# Patient Record
Sex: Female | Born: 1990 | Race: White | Hispanic: No | Marital: Single | Smoking: Current every day smoker
Health system: Southern US, Community
[De-identification: ages and names within clinical notes are randomized; demographics above are authoritative.]

## PROBLEM LIST (undated history)

## (undated) DIAGNOSIS — E079 Disorder of thyroid, unspecified: Secondary | ICD-10-CM

## (undated) HISTORY — PX: TUBAL LIGATION: SHX77

---
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
## 2019-10-11 ENCOUNTER — Other Ambulatory Visit: Payer: Self-pay

## 2019-10-11 ENCOUNTER — Emergency Department
Admission: EM | Admit: 2019-10-11 | Discharge: 2019-10-11 | Disposition: A | Discharge status: Home / Self Care | Payer: Medicaid Other | Attending: Emergency Medicine | Admitting: Emergency Medicine

## 2019-10-11 ENCOUNTER — Emergency Department: Payer: Medicaid Other

## 2019-10-11 DIAGNOSIS — F17210 Nicotine dependence, cigarettes, uncomplicated: Secondary | ICD-10-CM | POA: Insufficient documentation

## 2019-10-11 DIAGNOSIS — J069 Acute upper respiratory infection, unspecified: Secondary | ICD-10-CM | POA: Insufficient documentation

## 2019-10-11 DIAGNOSIS — Z20822 Contact with and (suspected) exposure to covid-19: Secondary | ICD-10-CM | POA: Insufficient documentation

## 2019-10-11 HISTORY — DX: Disorder of thyroid, unspecified: E07.9

## 2019-10-11 LAB — SARS CORONAVIRUS 2 BY RT PCR (HOSPITAL ORDER, PERFORMED IN ~~LOC~~ HOSPITAL LAB): SARS Coronavirus 2: NEGATIVE

## 2019-10-11 MED ORDER — PSEUDOEPH-BROMPHEN-DM 30-2-10 MG/5ML PO SYRP: 5.0000 mL | ORAL_SOLUTION | Freq: Four times a day (QID) | ORAL | 0 refills | Status: DC | PRN

## 2019-10-11 NOTE — ED Triage Notes (Signed)
Pt c/o cough with runny nose for the past couple of days, "I just want to get checked out".Marland Kitchen

## 2019-10-11 NOTE — Discharge Instructions (Addendum)
Advised self quarantine pending results of COVID-19 test.  Test results will be in the MyChart app later today.  Your chest x-ray was unremarkable.  Follow discharge care instructions and take medication as directed.  If test is positive must quarantine additional 10 days.

## 2019-10-11 NOTE — ED Provider Notes (Signed)
[facility] Emergency Department Provider Note   ____________________________________________   First MD Initiated Contact with Patient 10/11/19 [phone]     (approximate)  I have reviewed the triage vital signs and the nursing notes.   HISTORY  Chief Complaint URI    HPI Stephanie Hartman is a 29 y.o. female patient complain of cough and runny nose for 3 to 4 days.  Patient denies recent travel or known contact with COVID-19.  Patient stated nausea, vomiting, diarrhea.  Patient states she has aversion to taking the vaccine.       Past Medical History:  Diagnosis Date  . Thyroid disease     There are no problems to display for this patient.   History reviewed. No pertinent surgical history.  Prior to Admission medications   Medication Sig Start Date End Date Taking? Authorizing Provider  brompheniramine-pseudoephedrine-DM 30-2-10 MG/5ML syrup Take 5 mLs by mouth 4 (four) times daily as needed. 10/11/19   Joni Reining, PA-C    Allergies Patient has no known allergies.  No family history on file.  Social History Social History   Tobacco Use  . Smoking status: Current Every Day Smoker    Types: Cigarettes  . Smokeless tobacco: Never Used  Substance Use Topics  . Alcohol use: Not Currently  . Drug use: Not Currently    Review of Systems Constitutional: No fever/chills Eyes: No visual changes. ENT: No sore throat.  Runny nose. Cardiovascular: Denies chest pain. Respiratory: Denies shortness of breath.  Nonproductive cough. Gastrointestinal: No abdominal pain.  No nausea, no vomiting.  No diarrhea.  No constipation. Genitourinary: Negative for dysuria. Musculoskeletal: Negative for back pain. Skin: Negative for rash. Neurological: Negative for headaches, focal weakness or numbness.   ____________________________________________   PHYSICAL EXAM:  VITAL SIGNS: ED Triage Vitals [10/11/19 0901]  Enc Vitals Group     BP 105/67      Pulse Rate 85     Resp 16     Temp 98.4 F (36.9 C)     Temp Source Oral     SpO2 100 %     Weight 120 lb (54.4 kg)     Height 5\' 1"  (1.549 m)     Head Circumference      Peak Flow      Pain Score 0     Pain Loc      Pain Edu?      Excl. in GC?    Constitutional: Alert and oriented. Well appearing and in no acute distress. Eyes: Conjunctivae are normal. PERRL. EOMI. Head: Atraumatic. Nose: Clear rhinorrhea.. Mouth/Throat: Mucous membranes are moist.  Oropharynx erythematous. Neck: No stridor.   Hematological/Lymphatic/Immunilogical: No cervical lymphadenopathy. Cardiovascular: Normal rate, regular rhythm. Grossly normal heart sounds.  Good peripheral circulation. Respiratory: Normal respiratory effort.  No retractions. Lungs CTAB. Gastrointestinal: Soft and nontender. No distention. No abdominal bruits. No CVA tenderness. Genitourinary: Deferred Skin:  Skin is warm, dry and intact. No rash noted. Psychiatric: Mood and affect are normal. Speech and behavior are normal.  ____________________________________________   LABS (all labs ordered are listed, but only abnormal results are displayed)  Labs Reviewed  SARS CORONAVIRUS 2 BY RT PCR (HOSPITAL ORDER, PERFORMED IN [facility])   ____________________________________________  EKG   ____________________________________________  RADIOLOGY  ED MD interpretation:    Official radiology report(s): DG Chest Portable 1 View  Result Date: 10/11/2019 CLINICAL DATA:  Cough and congestion. EXAM: PORTABLE CHEST 1 VIEW COMPARISON:  None. FINDINGS: The heart  size and mediastinal contours are within normal limits. Both lungs are clear. No acute osseous abnormality. Multilevel left rib posttraumatic deformities. IMPRESSION: No focal airspace disease. Electronically Signed   By: Stana Bunting M.D.   On: 10/11/2019 10:14    ____________________________________________   PROCEDURES  Procedure(s) performed  (including Critical Care):  Procedures   ____________________________________________   INITIAL IMPRESSION / ASSESSMENT AND PLAN / ED COURSE  As part of my medical decision making, I reviewed the following data within the electronic MEDICAL RECORD NUMBER     Patient presents with cough and runny nose for a few days.  Discussed no acute findings on chest x-ray.  Patient advised to self quarantine pending results of COVID-19 test.  Patient given discharge care instruction and prescription for Bromfed-DM.          ____________________________________________   FINAL CLINICAL IMPRESSION(S) / ED DIAGNOSES  Final diagnoses:  Upper respiratory tract infection, unspecified type     ED Discharge Orders         Ordered    brompheniramine-pseudoephedrine-DM 30-2-10 MG/5ML syrup  4 times daily PRN        10/11/19 1021          *Please note:  Stephanie Hartman was evaluated in Emergency Department on 10/11/2019 for the symptoms described in the history of present illness. She was evaluated in the context of the global COVID-19 pandemic, which necessitated consideration that the patient might be at risk for infection with the SARS-CoV-2 virus that causes COVID-19. Institutional protocols and algorithms that pertain to the evaluation of patients at risk for COVID-19 are in a state of rapid change based on information released by regulatory bodies including the CDC and federal and state organizations. These policies and algorithms were followed during the patient's care in the ED.  Some ED evaluations and interventions may be delayed as a result of limited staffing during and the pandemic.*   Note:  This document was prepared using Dragon voice recognition software and may include unintentional dictation errors.    Joni Reining, PA-C 10/11/19 1025    [place]ton [place], MD 10/11/19 1536

## 2019-11-08 ENCOUNTER — Emergency Department
Admission: EM | Admit: 2019-11-08 | Discharge: 2019-11-08 | Disposition: A | Discharge status: Home / Self Care | Payer: Medicaid Other | Attending: Emergency Medicine | Admitting: Emergency Medicine

## 2019-11-08 ENCOUNTER — Other Ambulatory Visit: Payer: Self-pay

## 2019-11-08 ENCOUNTER — Emergency Department: Payer: Medicaid Other

## 2019-11-08 DIAGNOSIS — R0789 Other chest pain: Secondary | ICD-10-CM | POA: Insufficient documentation

## 2019-11-08 DIAGNOSIS — F17210 Nicotine dependence, cigarettes, uncomplicated: Secondary | ICD-10-CM | POA: Insufficient documentation

## 2019-11-08 DIAGNOSIS — J40 Bronchitis, not specified as acute or chronic: Secondary | ICD-10-CM

## 2019-11-08 LAB — CBC
HCT: 39.4 % (ref 36.0–46.0)
Hemoglobin: 13.1 g/dL (ref 12.0–15.0)
MCH: 28.6 pg (ref 26.0–34.0)
MCHC: 33.2 g/dL (ref 30.0–36.0)
MCV: 86 fL (ref 80.0–100.0)
Platelets: 212 10*3/uL (ref 150–400)
RBC: 4.58 MIL/uL (ref 3.87–5.11)
RDW: 15.9 % — ABNORMAL HIGH (ref 11.5–15.5)
WBC: 7.5 10*3/uL (ref 4.0–10.5)
nRBC: 0 % (ref 0.0–0.2)

## 2019-11-08 LAB — BASIC METABOLIC PANEL
Anion gap: 8 (ref 5–15)
BUN: 19 mg/dL (ref 6–20)
CO2: 27 mmol/L (ref 22–32)
Calcium: 9.1 mg/dL (ref 8.9–10.3)
Chloride: 102 mmol/L (ref 98–111)
Creatinine, Ser: 0.91 mg/dL (ref 0.44–1.00)
GFR, Estimated: >60 mL/min (ref >60)
Glucose, Bld: 71 mg/dL (ref 70–99)
Potassium: 4 mmol/L (ref 3.5–5.1)
Sodium: 137 mmol/L (ref 135–145)

## 2019-11-08 LAB — TROPONIN I (HIGH SENSITIVITY): Troponin I (High Sensitivity): 3 ng/L (ref <18)

## 2019-11-08 MED ORDER — ALBUTEROL SULFATE HFA 108 (90 BASE) MCG/ACT IN AERS: 2.0000 | INHALATION_SPRAY | RESPIRATORY_TRACT | 1 refills | Status: DC | PRN

## 2019-11-08 MED ORDER — PREDNISONE 20 MG PO TABS: 40.0000 mg | ORAL_TABLET | Freq: Every day | ORAL | 0 refills | Status: AC

## 2019-11-08 MED ORDER — AZITHROMYCIN 250 MG PO TABS: ORAL_TABLET | ORAL | 0 refills | Status: AC

## 2019-11-08 NOTE — ED Notes (Signed)
Unable to complete e-sig for discharge d/t patient being in the hallway with no access to e-sig pad. Pt verbalized understanding of all discharge instructions, medications, and f/u care.

## 2019-11-08 NOTE — ED Provider Notes (Signed)
[facility] Emergency Department Provider Note  ____________________________________________   First MD Initiated Contact with Patient 11/08/19 1516     (approximate)  I have reviewed the triage vital signs and the nursing notes.   HISTORY  Chief Complaint Chest Pain    HPI Stephanie Hartman is a 29 y.o. female  Here with chest pain, cough. Pt reports that for the past 3 days, she's had a dry sensation in her chest with a "coldness" and aching pain, worse when she coughs. She works at [employer] and has been in and out of the large freezer, which has made her symptoms worse. She smokes and has been told she has "early COPD" but does not use an inhaler. CP is intermittent, no specific alleviating factors. CP is worse w/ coughing and with exposure to the cold. No fever, chills. Of note, her significant other did have COVID last week, she has had no fever or other sx.         Past Medical History:  Diagnosis Date  . Thyroid disease     There are no problems to display for this patient.   History reviewed. No pertinent surgical history.  Prior to Admission medications   Medication Sig Start Date End Date Taking? Authorizing Provider  albuterol (VENTOLIN HFA) 108 (90 Base) MCG/ACT inhaler Inhale 2 puffs into the lungs every 4 (four) hours as needed for wheezing or shortness of breath. 11/08/19   Shaune Pollack, MD  azithromycin (ZITHROMAX Z-PAK) 250 MG tablet Take 2 tablets (500 mg) on  Day 1,  followed by 1 tablet (250 mg) once daily on Days 2 through 5. 11/08/19 11/13/19  Shaune Pollack, MD  brompheniramine-pseudoephedrine-DM 30-2-10 MG/5ML syrup Take 5 mLs by mouth 4 (four) times daily as needed. 10/11/19   Joni Reining, PA-C  predniSONE (DELTASONE) 20 MG tablet Take 2 tablets (40 mg total) by mouth daily for 5 days. 11/08/19 11/13/19  Shaune Pollack, MD    Allergies Patient has no known allergies.  History reviewed. No pertinent family  history.  Social History Social History   Tobacco Use  . Smoking status: Current Every Day Smoker    Types: Cigarettes  . Smokeless tobacco: Never Used  Substance Use Topics  . Alcohol use: Not Currently  . Drug use: Not Currently    Review of Systems  Review of Systems  Constitutional: Positive for fatigue. Negative for fever.  HENT: Negative for congestion and sore throat.   Eyes: Negative for visual disturbance.  Respiratory: Positive for cough and shortness of breath.   Cardiovascular: Positive for chest pain.  Gastrointestinal: Negative for abdominal pain, diarrhea, nausea and vomiting.  Genitourinary: Negative for flank pain.  Musculoskeletal: Negative for back pain and neck pain.  Skin: Negative for rash and wound.  Neurological: Negative for weakness.     ____________________________________________  PHYSICAL EXAM:      VITAL SIGNS: ED Triage Vitals  Enc Vitals Group     BP 11/08/19 1222 117/73     Pulse Rate 11/08/19 1222 97     Resp 11/08/19 1222 18     Temp 11/08/19 1222 98.9 F (37.2 C)     Temp Source 11/08/19 1222 Oral     SpO2 11/08/19 1222 100 %     Weight 11/08/19 1217 120 lb (54.4 kg)     Height 11/08/19 1217 5\' 1"  (1.549 m)     Head Circumference --      Peak Flow --  Pain Score 11/08/19 1217 6     Pain Loc --      Pain Edu? --      Excl. in GC? --      Physical Exam Vitals and nursing note reviewed.  Constitutional:      General: She is not in acute distress.    Appearance: She is well-developed.  HENT:     Head: Normocephalic and atraumatic.  Eyes:     Conjunctiva/sclera: Conjunctivae normal.  Cardiovascular:     Rate and Rhythm: Normal rate and regular rhythm.     Heart sounds: Normal heart sounds. No murmur heard.  No friction rub.  Pulmonary:     Effort: Pulmonary effort is normal. No respiratory distress.     Breath sounds: Wheezing (Scant, end expiratory) present. No rales.  Abdominal:     General: There is no  distension.     Palpations: Abdomen is soft.     Tenderness: There is no abdominal tenderness.  Musculoskeletal:     Cervical back: Neck supple.  Skin:    General: Skin is warm.     Capillary Refill: Capillary refill takes less than 2 seconds.  Neurological:     Mental Status: She is alert and oriented to person, place, and time.     Motor: No abnormal muscle tone.       ____________________________________________   LABS (all labs ordered are listed, but only abnormal results are displayed)  Labs Reviewed  CBC - Abnormal; Notable for the following components:      Result Value   RDW 15.9 (*)    All other components within normal limits  BASIC METABOLIC PANEL  POC URINE PREG, ED  TROPONIN I (HIGH SENSITIVITY)  TROPONIN I (HIGH SENSITIVITY)    ____________________________________________  EKG: Normal sinus rhythm, ventricular rate 95.  PR 144, QRS 64, QTc 432.  No acute ST elevations or depressions.  No ischemia or infarct. ________________________________________  RADIOLOGY All imaging, including plain films, CT scans, and ultrasounds, independently reviewed by me, and interpretations confirmed via formal radiology reads.  ED MD interpretation:   Chest x-ray: COPD, no acute abnormality  Official radiology report(s): DG Chest 2 View  Result Date: 11/08/2019 CLINICAL DATA:  Chest pain cough short of breath 2 days.  Smoker. EXAM: CHEST - 2 VIEW COMPARISON:  10/11/2019 FINDINGS: Pulmonary hyperinflation compatible with COPD and emphysema. Lungs are clear without infiltrate or effusion. Heart size and vascularity normal. Chronic left rib fractures again noted and unchanged. IMPRESSION: COPD.  No acute cardiopulmonary abnormality. Electronically Signed   By: Marlan Palau M.D.   On: 11/08/2019 13:05    ____________________________________________  PROCEDURES   Procedure(s) performed (including Critical  Care):  Procedures  ____________________________________________  INITIAL IMPRESSION / MDM / ASSESSMENT AND PLAN / ED COURSE  As part of my medical decision making, I reviewed the following data within the electronic MEDICAL RECORD NUMBER Nursing notes reviewed and incorporated, Old chart reviewed, Notes from prior ED visits, and [place] Controlled Substance Database       *Stephanie Hartman was evaluated in Emergency Department on 11/08/2019 for the symptoms described in the history of present illness. She was evaluated in the context of the global COVID-19 pandemic, which necessitated consideration that the patient might be at risk for infection with the SARS-CoV-2 virus that causes COVID-19. Institutional protocols and algorithms that pertain to the evaluation of patients at risk for COVID-19 are in a state of rapid change based on information released by regulatory bodies including  the [employer] and federal and state organizations. These policies and algorithms were followed during the patient's care in the ED.  Some ED evaluations and interventions may be delayed as a result of limited staffing during the pandemic.*     Medical Decision Making: 29 year old female here with atypical chest pain, cough, and dryness sensation in her chest.  Patient has mild, early COPD and suspect she has acute COPD exacerbation in the setting of cold exposure versus viral URI.  She does have a previous Covid exposure, will check Covid as an outpatient though she is not hypoxic and does not meet inpatient criteria for this.  Her chest x-ray shows no focal pneumonia.  This was reviewed by me.  EKG shows no evidence of ischemia or infarct and troponin is negative despite symptoms fairly constant for greater than 24 hours, making ACS highly unlikely.  She is PERC negative no signs of DVT/PE.  Will treat with albuterol, steroids, and outpatient follow-up. Given +sputum production, will add Azithro as  well.  ____________________________________________  FINAL CLINICAL IMPRESSION(S) / ED DIAGNOSES  Final diagnoses:  Atypical chest pain  Bronchitis     MEDICATIONS GIVEN DURING THIS VISIT:  Medications - No data to display   ED Discharge Orders         Ordered    albuterol (VENTOLIN HFA) 108 (90 Base) MCG/ACT inhaler  Every 4 hours PRN        11/08/19 1531    predniSONE (DELTASONE) 20 MG tablet  Daily        11/08/19 1531    azithromycin (ZITHROMAX Z-PAK) 250 MG tablet        11/08/19 1531           Note:  This document was prepared using Dragon voice recognition software and may include unintentional dictation errors.   Shaune Pollack, MD 11/08/19 [phone]

## 2019-11-08 NOTE — ED Triage Notes (Signed)
Pt comes POV for chest pain for 2 days. Pt states a "dry feeling" in the center upper part of her chest.

## 2020-05-15 ENCOUNTER — Other Ambulatory Visit: Payer: Self-pay

## 2020-05-15 ENCOUNTER — Emergency Department: Payer: Medicaid Other

## 2020-05-15 ENCOUNTER — Emergency Department
Admission: EM | Admit: 2020-05-15 | Discharge: 2020-05-15 | Disposition: A | Discharge status: Home / Self Care | Payer: Medicaid Other | Attending: Emergency Medicine | Admitting: Emergency Medicine

## 2020-05-15 ENCOUNTER — Other Ambulatory Visit (HOSPITAL_COMMUNITY): Payer: Self-pay | Admitting: Interventional Radiology

## 2020-05-15 ENCOUNTER — Encounter: Payer: Self-pay | Admitting: Emergency Medicine

## 2020-05-15 DIAGNOSIS — R5383 Other fatigue: Secondary | ICD-10-CM | POA: Insufficient documentation

## 2020-05-15 DIAGNOSIS — R220 Localized swelling, mass and lump, head: Secondary | ICD-10-CM | POA: Insufficient documentation

## 2020-05-15 DIAGNOSIS — F17210 Nicotine dependence, cigarettes, uncomplicated: Secondary | ICD-10-CM | POA: Insufficient documentation

## 2020-05-15 DIAGNOSIS — Q2730 Arteriovenous malformation, site unspecified: Secondary | ICD-10-CM | POA: Insufficient documentation

## 2020-05-15 DIAGNOSIS — R55 Syncope and collapse: Secondary | ICD-10-CM | POA: Insufficient documentation

## 2020-05-15 LAB — CBC
HCT: 42.2 % (ref 36.0–46.0)
Hemoglobin: 14.5 g/dL (ref 12.0–15.0)
MCH: 31.7 pg (ref 26.0–34.0)
MCHC: 34.4 g/dL (ref 30.0–36.0)
MCV: 92.1 fL (ref 80.0–100.0)
Platelets: 205 10*3/uL (ref 150–400)
RBC: 4.58 MIL/uL (ref 3.87–5.11)
RDW: 12.6 % (ref 11.5–15.5)
WBC: 6.6 10*3/uL (ref 4.0–10.5)
nRBC: 0 % (ref 0.0–0.2)

## 2020-05-15 LAB — BASIC METABOLIC PANEL
Anion gap: 10 (ref 5–15)
BUN: 21 mg/dL — ABNORMAL HIGH (ref 6–20)
CO2: 22 mmol/L (ref 22–32)
Calcium: 10 mg/dL (ref 8.9–10.3)
Chloride: 102 mmol/L (ref 98–111)
Creatinine, Ser: 0.95 mg/dL (ref 0.44–1.00)
GFR, Estimated: >60 mL/min (ref >60)
Glucose, Bld: 123 mg/dL — ABNORMAL HIGH (ref 70–99)
Potassium: 3.9 mmol/L (ref 3.5–5.1)
Sodium: 134 mmol/L — ABNORMAL LOW (ref 135–145)

## 2020-05-15 LAB — TROPONIN I (HIGH SENSITIVITY): Troponin I (High Sensitivity): <2 ng/L (ref <18)

## 2020-05-15 LAB — D-DIMER, QUANTITATIVE: D-Dimer, Quant: <0.27 ug/mL-FEU (ref 0.00–0.50)

## 2020-05-15 IMAGING — CT CT ANGIO HEAD
3 of 10 series · 16 of 47 positions shown · IV contrast (omnipaque)
Comparison: None

CLINICAL DATA: Moderate to severe prior head trauma several months
ago. Now with pulsatile mass left temporal area. Dizziness and
syncope

EXAM:
CT ANGIOGRAPHY HEAD
TECHNIQUE: Multidetector CT imaging of the head was performed using the
standard protocol during bolus administration of intravenous
contrast. Multiplanar CT image reconstructions and MIPs were
obtained to evaluate the vascular anatomy.
CONTRAST:  75mL OMNIPAQUE IOHEXOL 350 MG/ML SOLN

[Series 11: ax thin · axial · 0.33mm/px · z∈[-182,-52]mm · 10 of 153 slices shown]
[im 11/153  brain]
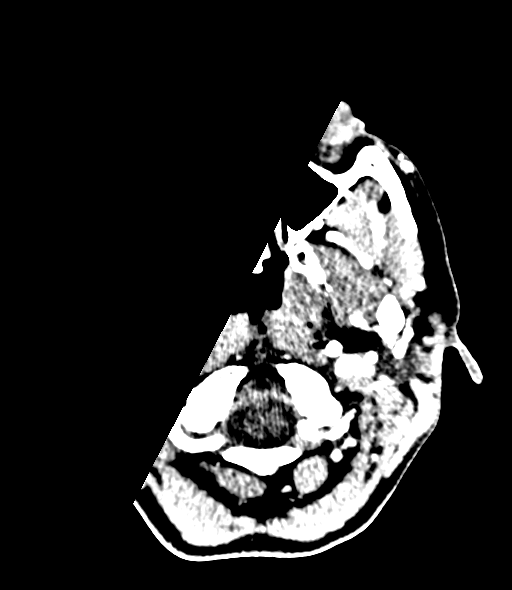
[im 31/153  bone]
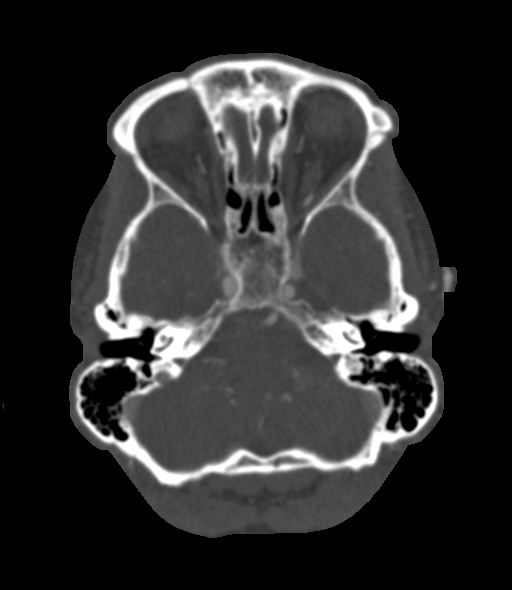
[im 41/153  brain]
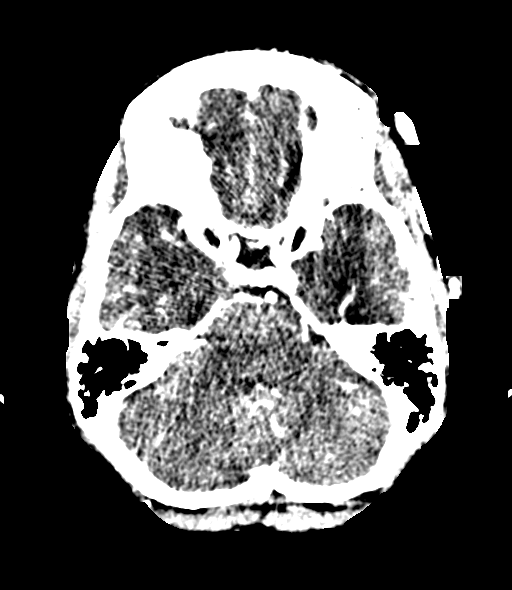
[im 51/153  bone]
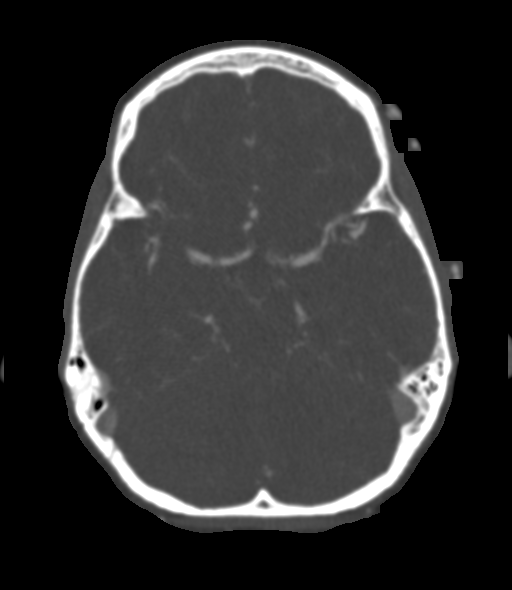
[im 71/153  brain]
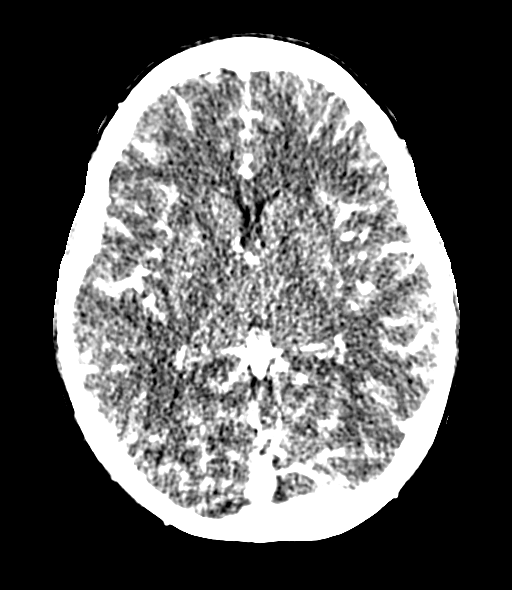
[im 82/153  bone]
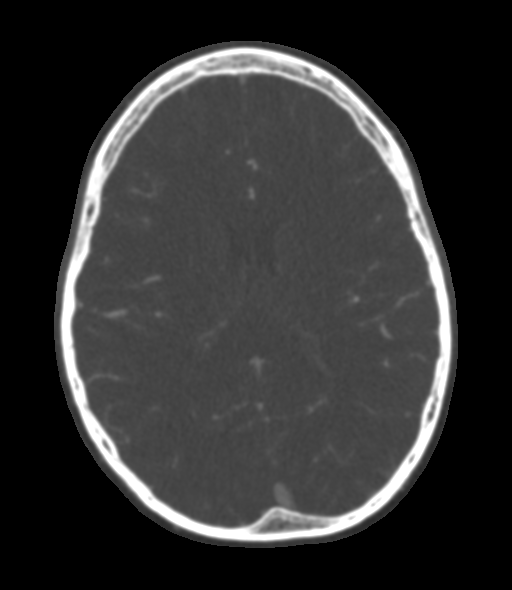
[im 102/153  brain]
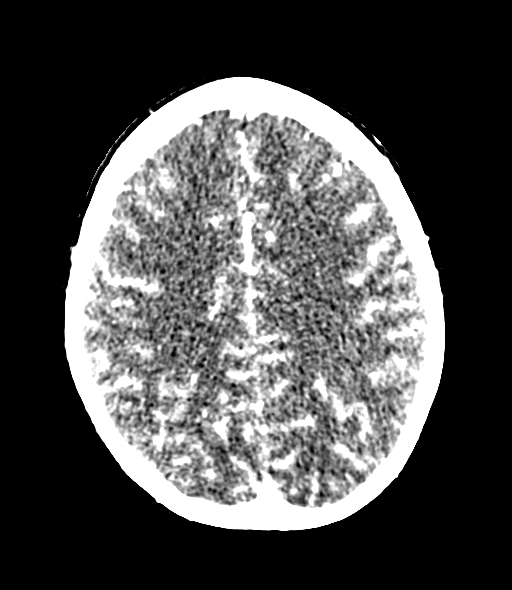
[im 112/153  bone]
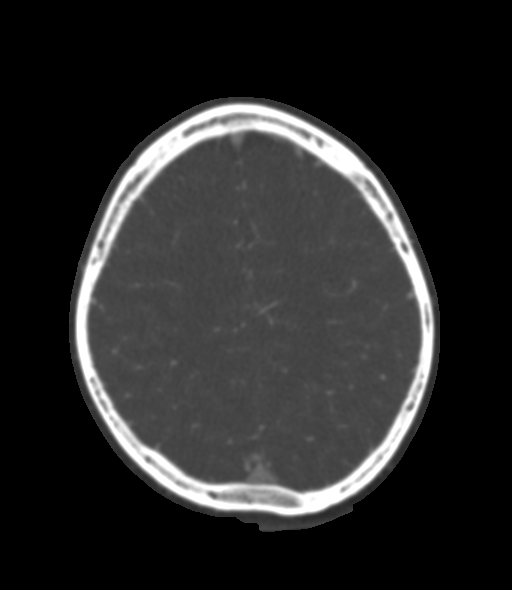
[im 122/153  brain]
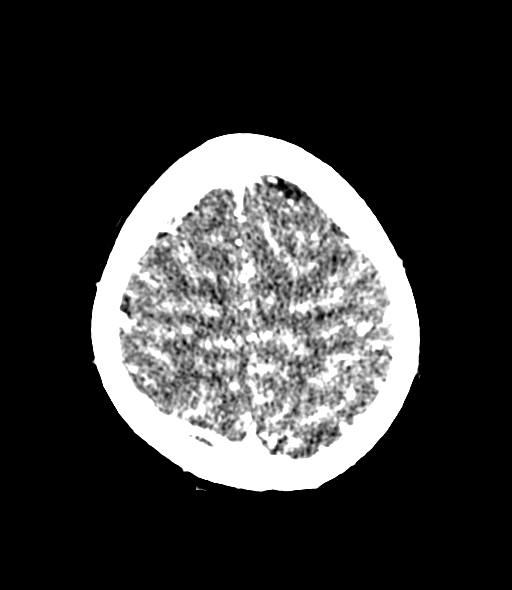
[im 142/153  bone]
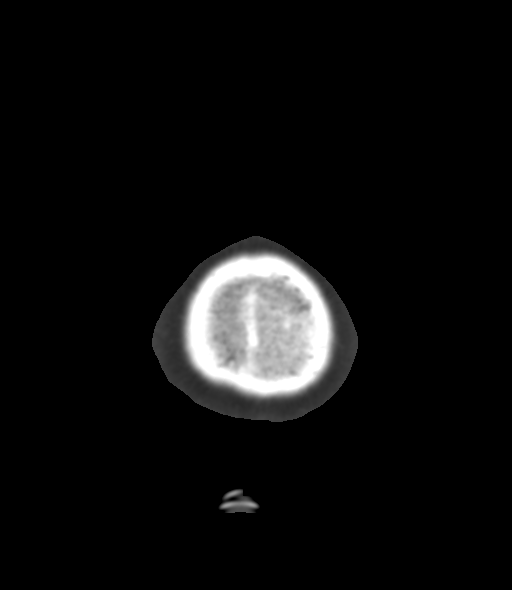

[Series 13: cor thin · coronal · 0.31mm/px · 3 of 177 slices shown]
[im 36/177  brain]
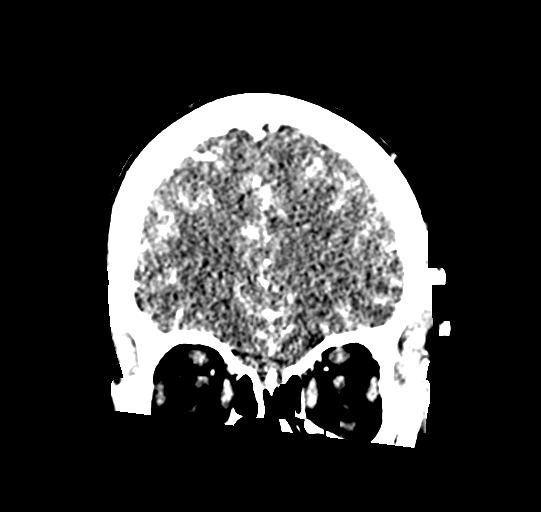
[im 71/177  brain]
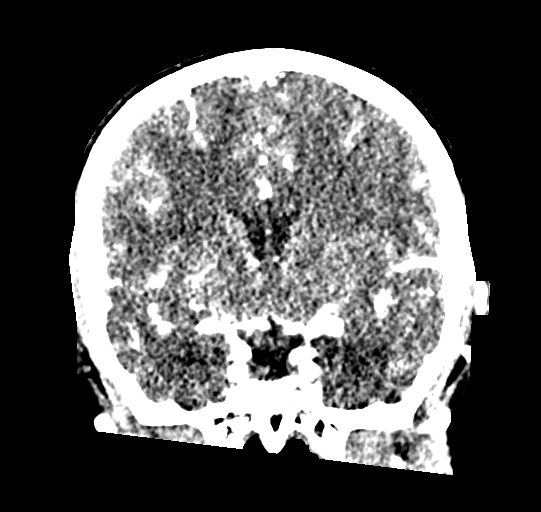
[im 106/177  brain]
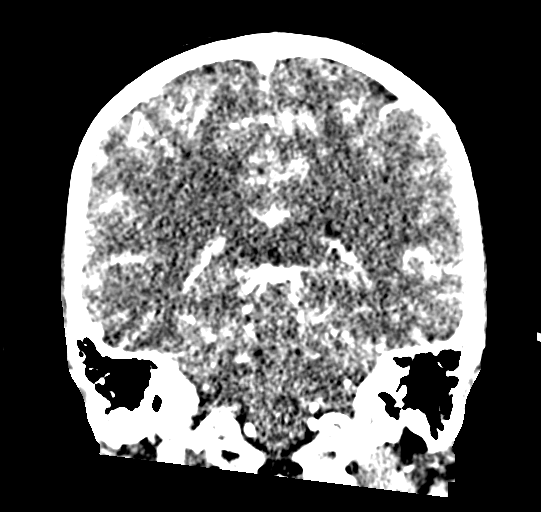

[Series 15: sag thin · sagittal · 0.31mm/px · 3 of 150 slices shown]
[im 38/150  brain]
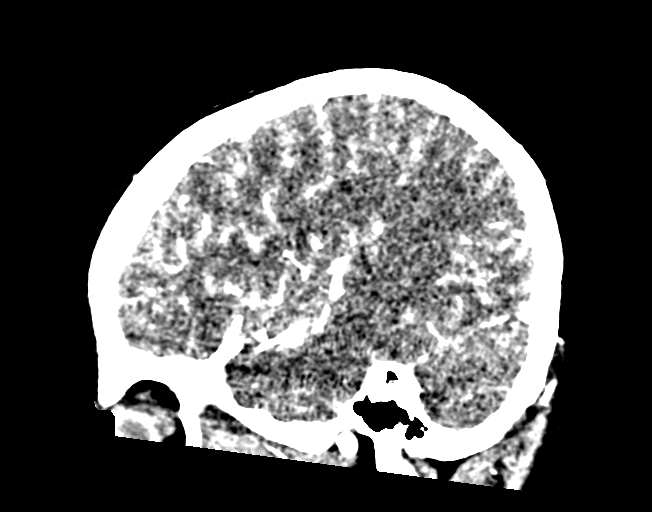
[im 75/150  brain]
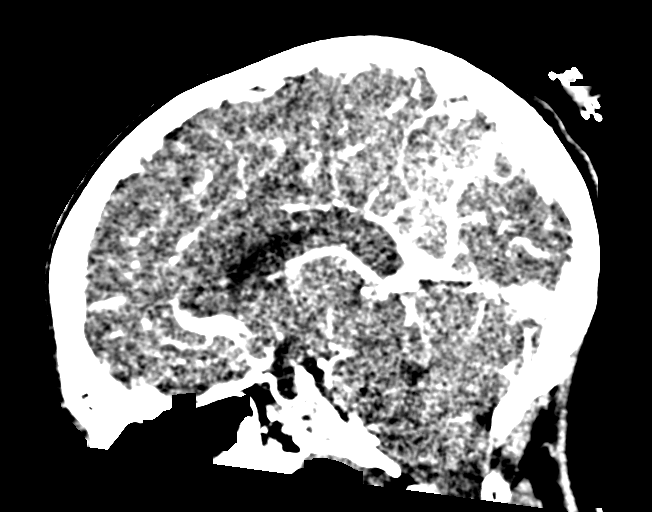
[im 112/150  brain]
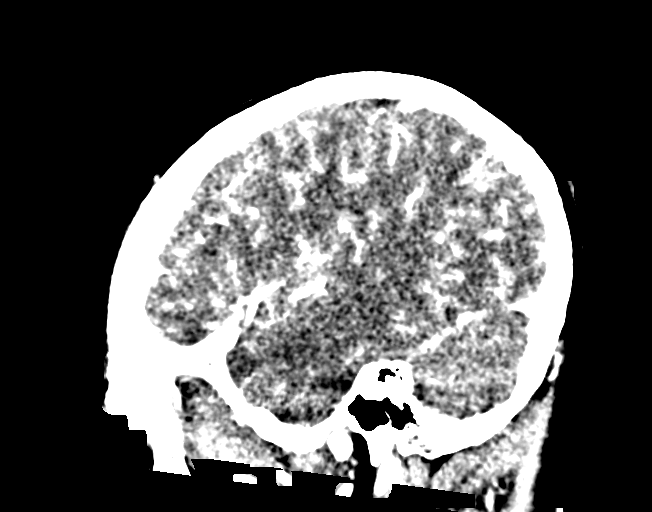

[16 of 47 positions shown; findings below may reference images not displayed]

FINDINGS: CT HEAD

Brain: No evidence of acute infarction, hemorrhage, hydrocephalus,
extra-axial collection or mass lesion/mass effect.

Vascular: Negative for hyperdense vessel

Skull: No skull fracture or skull lesion identified.

Sinuses: Negative

Other: None

CTA HEAD

Anterior circulation: No significant stenosis, proximal occlusion,
aneurysm, or vascular malformation. No intracranial vascular
malformation in the anterior circulation.

Posterior circulation: Both vertebral arteries patent to the
basilar. PICA patent bilaterally. Basilar widely patent. Superior
cerebellar and posterior cerebral arteries normal bilaterally. No
vascular malformation in the posterior circulation.

Venous sinuses: Normal and symmetric venous enhancement bilaterally.

Anatomic variants: None

Multiple tortuous and dilated vessels in the left temporal scalp.
These are fed by an enlarged left temporal artery. Fusiform
dilatation of a vessel lateral and superior to the orbit measuring
approximately 3.7 mm in diameter is associated with the vascular
malformation. No extravasation or hematoma identified. No early
draining vein identified however this study is in the early arterial
phase.

Review of the MIP images confirms the above findings.
IMPRESSION: Normal intracranial circulation.  Normal CT head

Moderate to large vascular malformation in the left temporal scalp
most likely arteriovenous malformation. This could be congenital or
posttraumatic. Fusiform dilatation of a vessel superior and
posterior to the orbit measuring up to 3.7 mm in diameter.

These results were called by telephone at the time of interpretation
on 05/15/2020 at [DATE] to provider FREDY ALFONSO YATRA , who verbally
acknowledged these results.

## 2020-05-15 MED ORDER — LACTATED RINGERS IV BOLUS
1000.0000 mL | Freq: Once | INTRAVENOUS | Status: AC
  Administered 2020-05-15: 1000 mL via INTRAVENOUS

## 2020-05-15 MED ORDER — IOHEXOL 350 MG/ML SOLN
75.0000 mL | Freq: Once | INTRAVENOUS | Status: AC | PRN
  Administered 2020-05-15: 75 mL via INTRAVENOUS

## 2020-05-15 NOTE — Discharge Instructions (Signed)
Call one of the two numbers to set up follow-up this week or early next week for your vascular issue: Stephanie Hartman [phone] Stephanie Hartman [phone]  For your syncope: Drink at least 8 glasses of water daily Return to the ER as needed

## 2020-05-15 NOTE — ED Notes (Signed)
Pt to ED for centralized cp and syncopal episodes x2 weeks. Denies hitting head.  Denies N/V, shob

## 2020-05-15 NOTE — ED Provider Notes (Signed)
[facility] Emergency Department Provider Note  ____________________________________________   Event Date/Time   First MD Initiated Contact with Patient 05/15/20 1128     (approximate)  I have reviewed the triage vital signs and the nursing notes.   HISTORY  Chief Complaint Near Syncope    HPI Stephanie Hartman is a 30 y.o. female here with near syncopal episode x2.  The patient states that over the last week, she has had 2 episodes in which she feels lightheaded and like she is going to pass out.  Both of these episodes have occurred when she is standing.  She states that twice, she has felt like she suddenly feels very lightheaded.  She feels like her vision is "blacking out."  She states that both of these times, she was able to sit down, take a deep breath, and applied cool rag to her head.  She believes she may have briefly lost consciousness the first time but the second time did not.  The last episode was yesterday.  She reports that otherwise, she has felt well.  No recent fevers or chills.  No recent medication changes.  She has a history of similar episodes in the past but not 2 episodes in the last week.  She does admit that she works a lot and is on her feet a lot.  She tries to drink but does not always drink as much as she normally should.  Otherwise, she incidentally makes note of an increasing area of swelling along her left temple area.  This is a appeared and worsened over the last month.  She feels like she can feel her heartbeat in it.  It is felt warm in the past.  Denies any trauma here recently, though did have trauma several years ago.  No piercings.  No other injury to the area.  No other complaints.  No vision changes.        Past Medical History:  Diagnosis Date  . Thyroid disease     There are no problems to display for this patient.   History reviewed. No pertinent surgical history.  Prior to Admission medications   Medication Sig  Start Date End Date Taking? Authorizing Provider  albuterol (VENTOLIN HFA) 108 (90 Base) MCG/ACT inhaler Inhale 2 puffs into the lungs every 4 (four) hours as needed for wheezing or shortness of breath. 11/08/19   Shaune Pollack, MD  brompheniramine-pseudoephedrine-DM 30-2-10 MG/5ML syrup Take 5 mLs by mouth 4 (four) times daily as needed. 10/11/19   Joni Reining, PA-C    Allergies Patient has no known allergies.  History reviewed. No pertinent family history.  Social History Social History   Tobacco Use  . Smoking status: Current Every Day Smoker    Types: Cigarettes  . Smokeless tobacco: Never Used  Substance Use Topics  . Alcohol use: Not Currently  . Drug use: Not Currently    Review of Systems  Review of Systems  Constitutional: Positive for fatigue. Negative for chills and fever.  HENT: Negative for congestion and sore throat.   Eyes: Negative for visual disturbance.  Respiratory: Negative for cough and shortness of breath.   Cardiovascular: Negative for chest pain.  Gastrointestinal: Negative for abdominal pain, diarrhea, nausea and vomiting.  Genitourinary: Negative for flank pain.  Musculoskeletal: Negative for back pain and neck pain.  Skin: Negative for rash and wound.  Allergic/Immunologic: Negative for immunocompromised state.  Neurological: Positive for weakness and headaches. Negative for numbness.  Hematological: Does not  bruise/bleed easily.     ____________________________________________  PHYSICAL EXAM:      VITAL SIGNS: ED Triage Vitals  Enc Vitals Group     BP 05/15/20 1124 112/85     Pulse Rate 05/15/20 1124 92     Resp 05/15/20 1124 16     Temp 05/15/20 1124 98.2 F (36.8 C)     Temp Source 05/15/20 1124 Oral     SpO2 05/15/20 1124 98 %     Weight 05/15/20 1121 130 lb (59 kg)     Height 05/15/20 1121 5\' 1"  (1.549 m)     Head Circumference --      Peak Flow --      Pain Score 05/15/20 1121 0     Pain Loc --      Pain Edu? --       Excl. in GC? --      Physical Exam Vitals and nursing note reviewed.  Constitutional:      General: She is not in acute distress.    Appearance: She is well-developed.  HENT:     Head: Normocephalic and atraumatic.     Comments: Swelling noted to left temporal artery with thrill, palpable and audible. No erythema. No fluctuance. Eyes:     Conjunctiva/sclera: Conjunctivae normal.  Cardiovascular:     Rate and Rhythm: Normal rate and regular rhythm.     Heart sounds: Normal heart sounds. No murmur heard. No friction rub.  Pulmonary:     Effort: Pulmonary effort is normal. No respiratory distress.     Breath sounds: Normal breath sounds. No wheezing or rales.  Abdominal:     General: There is no distension.     Palpations: Abdomen is soft.     Tenderness: There is no abdominal tenderness.  Musculoskeletal:     Cervical back: Neck supple.  Skin:    General: Skin is warm.     Capillary Refill: Capillary refill takes less than 2 seconds.  Neurological:     Mental Status: She is alert and oriented to person, place, and time.     Motor: No abnormal muscle tone.       ____________________________________________   LABS (all labs ordered are listed, but only abnormal results are displayed)  Labs Reviewed  BASIC METABOLIC PANEL - Abnormal; Notable for the following components:      Result Value   Sodium 134 (*)    Glucose, Bld 123 (*)    BUN 21 (*)    All other components within normal limits  CBC  D-DIMER, QUANTITATIVE  URINALYSIS, COMPLETE (UACMP) WITH MICROSCOPIC  CBG MONITORING, ED  POC URINE PREG, ED  TROPONIN I (HIGH SENSITIVITY)    ____________________________________________  EKG: Normal sinus rhythm, ventricular rate 80.  PR 147, QRS 78, QTc 420.  No acute ST elevations or depressions.  No acute evidence of acute ischemia or infarct. ________________________________________  RADIOLOGY All imaging, including plain films, CT scans, and ultrasounds,  independently reviewed by me, and interpretations confirmed via formal radiology reads.  ED MD interpretation:   CT angio head: AV malformation at the left temporal scalp, likely involving the temporal artery,  Official radiology report(s): CT Angio Head W or Wo Contrast  Result Date: 05/15/2020 CLINICAL DATA:  Moderate to severe prior head trauma several months ago. Now with pulsatile mass left temporal area. Dizziness and syncope EXAM: CT ANGIOGRAPHY HEAD TECHNIQUE: Multidetector CT imaging of the head was performed using the standard protocol during bolus administration of intravenous contrast. Multiplanar  CT image reconstructions and MIPs were obtained to evaluate the vascular anatomy. CONTRAST:  54mL OMNIPAQUE IOHEXOL 350 MG/ML SOLN COMPARISON:  None FINDINGS: CT HEAD Brain: No evidence of acute infarction, hemorrhage, hydrocephalus, extra-axial collection or mass lesion/mass effect. Vascular: Negative for hyperdense vessel Skull: No skull fracture or skull lesion identified. Sinuses: Negative Other: None CTA HEAD Anterior circulation: No significant stenosis, proximal occlusion, aneurysm, or vascular malformation. No intracranial vascular malformation in the anterior circulation. Posterior circulation: Both vertebral arteries patent to the basilar. PICA patent bilaterally. Basilar widely patent. Superior cerebellar and posterior cerebral arteries normal bilaterally. No vascular malformation in the posterior circulation. Venous sinuses: Normal and symmetric venous enhancement bilaterally. Anatomic variants: None Multiple tortuous and dilated vessels in the left temporal scalp. These are fed by an enlarged left temporal artery. Fusiform dilatation of a vessel lateral and superior to the orbit measuring approximately 3.7 mm in diameter is associated with the vascular malformation. No extravasation or hematoma identified. No early draining vein identified however this study is in the early arterial phase.  Review of the MIP images confirms the above findings. IMPRESSION: Normal intracranial circulation.  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 This could be congenital or posttraumatic. Fusiform dilatation of a vessel superior and posterior to the orbit measuring up to 3.7 mm in diameter. These results were called by telephone at the time of interpretation on 05/15/2020 at 12:49 pm to provider Shaune Pollack , who verbally acknowledged these results. Electronically Signed   By: Marlan Palau M.D.   On: 05/15/2020 12:52    ____________________________________________  PROCEDURES   Procedure(s) performed (including Critical Care):  Procedures  ____________________________________________  INITIAL IMPRESSION / MDM / ASSESSMENT AND PLAN / ED COURSE  As part of my medical decision making, I reviewed the following data within the electronic MEDICAL RECORD NUMBER Nursing notes reviewed and incorporated, Old chart reviewed, Notes from prior ED visits, and [place] Controlled Substance Database       *Anina Schnake was evaluated in Emergency Department on 05/15/2020 for the symptoms described in the history of present illness. She was evaluated in the context of the global COVID-19 pandemic, which necessitated consideration that the patient might be at risk for infection with the SARS-CoV-2 virus that causes COVID-19. Institutional protocols and algorithms that pertain to the evaluation of patients at risk for COVID-19 are in a state of rapid change based on information released by regulatory bodies including the CDC and federal and state organizations. These policies and algorithms were followed during the patient's care in the ED.  Some ED evaluations and interventions may be delayed as a result of limited staffing during the pandemic.*     Medical Decision Making: 30 year old female here with syncopal episode.  Regarding her syncopal  episode, patient had prodrome and was standing at both times, likely orthostatic versus vasovagal.  She was actually able to stop herself from truly syncopized in, which is reassuring.  She has normal EKG with no evidence of arrhythmia, ischemia, or other abnormality here.  No clinical signs of DVT or PE and she is D-dimer negative.  She is not anemic.  Electrolytes acceptable.  No murmur or signs of valvular disorder.  Will encourage hydration and give return precautions.  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  CT angio was obtained after discussing with Dr. Chestine Spore, which does show a large vascular malformation.  I discussed with Dr.  Deveshwar of Neuro interventional who will see the pt this week or early next week in clinic for further work-up/management. Pt notified and is in agreement.   ____________________________________________  FINAL CLINICAL IMPRESSION(S) / ED DIAGNOSES  Final diagnoses:  Syncope and collapse  AVM (arteriovenous malformation)     MEDICATIONS GIVEN DURING THIS VISIT:  Medications  lactated ringers bolus 1,000 mL (1,000 mLs Intravenous New Bag/Given 05/15/20 1231)  iohexol (OMNIPAQUE) 350 MG/ML injection 75 mL (75 mLs Intravenous Contrast Given 05/15/20 1212)     ED Discharge Orders    None       Note:  This document was prepared using Dragon voice recognition software and may include unintentional dictation errors.   Shaune PollackIsaacs, Jaci Desanto, MD 05/15/20 1352

## 2020-05-15 NOTE — ED Triage Notes (Signed)
Pt comes into the ED via POV c/o syncopal episodes with the l;ast one being last night.  Pt states she was dizzy before it happened.  Pt also c/o CP that is centrally located.  Pt denies any medical problems other than hypothyroidism.  Pt states she has had a cough that started this week and thinks the CP is related to that.  Pt ambulatory to exam room for triage and has even and unlabored respirations.

## 2020-05-22 ENCOUNTER — Other Ambulatory Visit: Payer: Self-pay

## 2020-05-22 ENCOUNTER — Ambulatory Visit (HOSPITAL_COMMUNITY)
Admission: RE | Admit: 2020-05-22 | Discharge: 2020-05-22 | Disposition: A | Discharge status: Home / Self Care | Payer: Self-pay | Source: Ambulatory Visit | Attending: Interventional Radiology | Admitting: Interventional Radiology

## 2020-05-22 DIAGNOSIS — Q2730 Arteriovenous malformation, site unspecified: Secondary | ICD-10-CM

## 2020-05-23 HISTORY — PX: IR RADIOLOGIST EVAL & MGMT: IMG5224

## 2020-05-24 ENCOUNTER — Other Ambulatory Visit (HOSPITAL_COMMUNITY): Payer: Self-pay | Admitting: Interventional Radiology

## 2020-05-24 DIAGNOSIS — H93A9 Pulsatile tinnitus, unspecified ear: Secondary | ICD-10-CM

## 2020-05-29 ENCOUNTER — Other Ambulatory Visit: Payer: Self-pay | Admitting: Radiology

## 2020-05-30 ENCOUNTER — Ambulatory Visit (HOSPITAL_COMMUNITY)
Admission: RE | Admit: 2020-05-30 | Discharge: 2020-05-30 | Disposition: A | Discharge status: Home / Self Care | Payer: Medicaid Other | Source: Ambulatory Visit | Attending: Interventional Radiology | Admitting: Interventional Radiology

## 2020-05-30 ENCOUNTER — Other Ambulatory Visit (HOSPITAL_COMMUNITY): Payer: Self-pay | Admitting: Interventional Radiology

## 2020-05-30 ENCOUNTER — Other Ambulatory Visit: Payer: Self-pay

## 2020-05-30 DIAGNOSIS — Z91041 Radiographic dye allergy status: Secondary | ICD-10-CM | POA: Insufficient documentation

## 2020-05-30 DIAGNOSIS — H93A9 Pulsatile tinnitus, unspecified ear: Secondary | ICD-10-CM

## 2020-05-30 DIAGNOSIS — R55 Syncope and collapse: Secondary | ICD-10-CM | POA: Insufficient documentation

## 2020-05-30 DIAGNOSIS — Q279 Congenital malformation of peripheral vascular system, unspecified: Secondary | ICD-10-CM | POA: Insufficient documentation

## 2020-05-30 DIAGNOSIS — F17210 Nicotine dependence, cigarettes, uncomplicated: Secondary | ICD-10-CM | POA: Insufficient documentation

## 2020-05-30 DIAGNOSIS — I671 Cerebral aneurysm, nonruptured: Secondary | ICD-10-CM | POA: Insufficient documentation

## 2020-05-30 DIAGNOSIS — H538 Other visual disturbances: Secondary | ICD-10-CM | POA: Insufficient documentation

## 2020-05-30 HISTORY — PX: IR ANGIO INTRA EXTRACRAN SEL COM CAROTID INNOMINATE BILAT MOD SED: IMG5360

## 2020-05-30 HISTORY — PX: IR ANGIO VERTEBRAL SEL SUBCLAVIAN INNOMINATE BILAT MOD SED: IMG5366

## 2020-05-30 LAB — CBC
HCT: 40 % (ref 36.0–46.0)
Hemoglobin: 13.1 g/dL (ref 12.0–15.0)
MCH: 31.6 pg (ref 26.0–34.0)
MCHC: 32.8 g/dL (ref 30.0–36.0)
MCV: 96.4 fL (ref 80.0–100.0)
Platelets: 204 10*3/uL (ref 150–400)
RBC: 4.15 MIL/uL (ref 3.87–5.11)
RDW: 12.3 % (ref 11.5–15.5)
WBC: 5.7 10*3/uL (ref 4.0–10.5)
nRBC: 0 % (ref 0.0–0.2)

## 2020-05-30 LAB — PROTIME-INR
INR: 1 (ref 0.8–1.2)
Prothrombin Time: 12.8 seconds (ref 11.4–15.2)

## 2020-05-30 LAB — BASIC METABOLIC PANEL
Anion gap: 5 (ref 5–15)
BUN: 16 mg/dL (ref 6–20)
CO2: 25 mmol/L (ref 22–32)
Calcium: 8.7 mg/dL — ABNORMAL LOW (ref 8.9–10.3)
Chloride: 109 mmol/L (ref 98–111)
Creatinine, Ser: 0.81 mg/dL (ref 0.44–1.00)
GFR, Estimated: >60 mL/min (ref >60)
Glucose, Bld: 86 mg/dL (ref 70–99)
Potassium: 4 mmol/L (ref 3.5–5.1)
Sodium: 139 mmol/L (ref 135–145)

## 2020-05-30 LAB — PREGNANCY, URINE: Preg Test, Ur: NEGATIVE

## 2020-05-30 MED ORDER — DIPHENHYDRAMINE HCL 25 MG PO CAPS
50.0000 mg | ORAL_CAPSULE | ORAL | Status: DC
  Filled 2020-05-30: qty 2

## 2020-05-30 MED ORDER — SODIUM CHLORIDE 0.9 % IV SOLN: INTRAVENOUS | Status: AC

## 2020-05-30 MED ORDER — SODIUM CHLORIDE 0.9 % IV BOLUS
INTRAVENOUS | Status: AC | PRN
  Administered 2020-05-30: 250 mL via INTRAVENOUS

## 2020-05-30 MED ORDER — MIDAZOLAM HCL 2 MG/2ML IJ SOLN
INTRAMUSCULAR | Status: AC | PRN
  Administered 2020-05-30: 1 mg via INTRAVENOUS

## 2020-05-30 MED ORDER — MIDAZOLAM HCL 2 MG/2ML IJ SOLN
INTRAMUSCULAR | Status: AC
  Filled 2020-05-30: qty 2

## 2020-05-30 MED ORDER — LIDOCAINE HCL 1 % IJ SOLN
INTRAMUSCULAR | Status: AC
  Filled 2020-05-30: qty 20

## 2020-05-30 MED ORDER — DIPHENHYDRAMINE HCL 50 MG/ML IJ SOLN
INTRAMUSCULAR | Status: AC
  Administered 2020-05-30: 50 mg via INTRAVENOUS
  Filled 2020-05-30: qty 1

## 2020-05-30 MED ORDER — HEPARIN SODIUM (PORCINE) 1000 UNIT/ML IJ SOLN
INTRAMUSCULAR | Status: AC
  Filled 2020-05-30: qty 1

## 2020-05-30 MED ORDER — HEPARIN SODIUM (PORCINE) 1000 UNIT/ML IJ SOLN
INTRAMUSCULAR | Status: AC | PRN
  Administered 2020-05-30: 1000 [IU] via INTRAVENOUS

## 2020-05-30 MED ORDER — IOHEXOL 300 MG/ML  SOLN
150.0000 mL | Freq: Once | INTRAMUSCULAR | Status: AC | PRN
  Administered 2020-05-30: 75 mL

## 2020-05-30 MED ORDER — FENTANYL CITRATE (PF) 100 MCG/2ML IJ SOLN
INTRAMUSCULAR | Status: AC | PRN
  Administered 2020-05-30: 25 ug via INTRAVENOUS

## 2020-05-30 MED ORDER — DIPHENHYDRAMINE HCL 50 MG/ML IJ SOLN: 50.0000 mg | Freq: Once | INTRAMUSCULAR | Status: AC

## 2020-05-30 MED ORDER — FENTANYL CITRATE (PF) 100 MCG/2ML IJ SOLN
INTRAMUSCULAR | Status: AC
  Filled 2020-05-30: qty 2

## 2020-05-30 MED ORDER — SODIUM CHLORIDE 0.9 % IV SOLN: Freq: Once | INTRAVENOUS | Status: AC

## 2020-05-30 NOTE — Procedures (Signed)
S/ P 4 vessel cerebral arteriogram RT CFA approach. Findings . 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

## 2020-05-30 NOTE — H&P (Signed)
Chief Complaint: Patient was seen in consultation today for vascular malformation/diagnostic cerebral arteriogram.  Referring Physician(s): Reinaldo Meeker (EDP)  Supervising Physician: Julieanne Cotton  Patient Status: [facility] - Out-pt  History of Present Illness: Stephanie Hartman is a 30 y.o. female with a past medical history of tyroid disease. She has been having near syncope episodes for years. Episodes are described as feeling lightheaded followed by her vision "blacking out". Episodes became more frequent, so she went to [facility] ED 05/15/2020 for further evaluation. 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 She was discharged home same day with OP NIR follow-up. She consulted with Dr. Corliss Skains on OP basis 05/22/2020 to discuss management options. At that time, through shared decision making, patient decided to pursue a diagnostic cerebral arteriogram for further evaluation of vascular malformation.  CTA head 05/15/2020: 1. Normal intracranial circulation. Normal CT head. 2. 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 This could be congenital or posttraumatic. Fusiform dilatation of a vessel superior and posterior to the orbit measuring up to 3.7 mm in diameter.  Patient presents today for possible image-guided diagnostic cerebral arteriogram. Patient awake and alert sitting in bed. States she has not had a near-syncope episode in about a week. Denies N/V associated with these episodes. Complains of intermittent blurred vision of left eye. States occurs in AM and subsides as day progresses. Denies fever, chills, chest pain, dyspnea, abdominal pain, or headache.   Past Medical History:  Diagnosis Date  . Thyroid disease     Past Surgical History:  Procedure Laterality Date  . IR RADIOLOGIST EVAL & MGMT  05/23/2020    Allergies: Contrast media [iodinated  diagnostic agents]  Medications: Prior to Admission medications   Medication Sig Start Date End Date Taking? Authorizing Provider  albuterol (VENTOLIN HFA) 108 (90 Base) MCG/ACT inhaler Inhale 2 puffs into the lungs every 4 (four) hours as needed for wheezing or shortness of breath. 11/08/19  Yes Shaune Pollack, MD  aspirin-acetaminophen-caffeine (EXCEDRIN MIGRAINE) [phone] MG tablet Take 2 tablets by mouth every 6 (six) hours as needed for headache.   Yes [provider]  doxylamine, Sleep, (SLEEP-AID) 25 MG tablet Take 25 mg by mouth at bedtime as needed for sleep.   Yes [provider]  naphazoline-pheniramine (VISINE) 0.025-0.3 % ophthalmic solution Place 1 drop into both eyes daily as needed for eye irritation.   Yes [provider]     No family history on file.  Social History   Socioeconomic History  . Marital status: Single    Spouse name: Not on file  . Number of children: Not on file  . Years of education: Not on file  . Highest education level: Not on file  Occupational History  . Not on file  Tobacco Use  . Smoking status: Current Every Day Smoker    Types: Cigarettes  . Smokeless tobacco: Never Used  Substance and Sexual Activity  . Alcohol use: Not Currently  . Drug use: Not Currently  . Sexual activity: Not on file  Other Topics Concern  . Not on file  Social History Narrative  . Not on file   Social Determinants of Health   Financial Resource Strain: Not on file  Food Insecurity: Not on file  Transportation Needs: Not on file  Physical Activity: Not on file  Stress: Not on file  Social Connections: Not on file     Review of Systems: A  12 point ROS discussed and pertinent positives are indicated in the HPI above.  All other systems are negative.  Review of Systems  Constitutional: Negative for chills and fever.  Eyes: Positive for visual disturbance.  Respiratory: Negative for shortness of breath and wheezing.    Cardiovascular: Negative for chest pain and palpitations.  Gastrointestinal: Negative for abdominal pain, nausea and vomiting.  Neurological: Negative for headaches.  Psychiatric/Behavioral: Negative for behavioral problems and confusion.    Vital Signs: BP 112/74   Pulse 87   Temp 98.3 F (36.8 C) (Oral)   Resp 19   Ht 5\' 1"  (1.549 m)   Wt 130 lb (59 kg)   LMP 05/27/2020 (Exact Date)   SpO2 98%   BMI 24.56 kg/m   Physical Exam Vitals and nursing note reviewed.  Constitutional:      General: She is not in acute distress.    Appearance: Normal appearance.  Cardiovascular:     Rate and Rhythm: Normal rate and regular rhythm.     Heart sounds: Normal heart sounds. No murmur heard.   Pulmonary:     Effort: Pulmonary effort is normal. No respiratory distress.     Breath sounds: Normal breath sounds. No wheezing.  Skin:    General: Skin is warm and dry.  Neurological:     Mental Status: She is alert and oriented to person, place, and time.      MD Evaluation Airway: WNL Heart: WNL Abdomen: WNL Chest/ Lungs: WNL ASA  Classification: 2 Mallampati/Airway Score: One   Imaging: CT Angio Head W or Wo Contrast  Result Date: 05/15/2020 CLINICAL DATA:  Moderate to severe prior head trauma several months ago. Now with pulsatile mass left temporal area. Dizziness and syncope EXAM: CT ANGIOGRAPHY HEAD TECHNIQUE: Multidetector CT imaging of the head was performed using the standard protocol during bolus administration of intravenous contrast. Multiplanar CT image reconstructions and MIPs were obtained to evaluate the vascular anatomy. CONTRAST:  64mL OMNIPAQUE IOHEXOL 350 MG/ML SOLN COMPARISON:  None FINDINGS: CT HEAD Brain: No evidence of acute infarction, hemorrhage, hydrocephalus, extra-axial collection or mass lesion/mass effect. Vascular: Negative for hyperdense vessel Skull: No skull fracture or skull lesion identified. Sinuses: Negative Other: None CTA HEAD Anterior  circulation: No significant stenosis, proximal occlusion, aneurysm, or vascular malformation. No intracranial vascular malformation in the anterior circulation. Posterior circulation: Both vertebral arteries patent to the basilar. PICA patent bilaterally. Basilar widely patent. Superior cerebellar and posterior cerebral arteries normal bilaterally. No vascular malformation in the posterior circulation. Venous sinuses: Normal and symmetric venous enhancement bilaterally. Anatomic variants: None Multiple tortuous and dilated vessels in the left temporal scalp. These are fed by an enlarged left temporal artery. Fusiform dilatation of a vessel lateral and superior to the orbit measuring approximately 3.7 mm in diameter is associated with the vascular malformation. No extravasation or hematoma identified. No early draining vein identified however this study is in the early arterial phase. Review of the MIP images confirms the above findings. IMPRESSION: Normal intracranial circulation.  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 This could be congenital or posttraumatic. Fusiform dilatation of a vessel superior and posterior to the orbit measuring up to 3.7 mm in diameter. These results were called by telephone at the time of interpretation on 05/15/2020 at 12:49 pm to provider 05/17/2020 , who verbally acknowledged these results. Electronically Signed   By: Shaune Pollack M.D.   On: 05/15/2020 12:52  IR Radiologist Eval & Mgmt  Result Date: 05/23/2020 EXAM: NEW PATIENT OFFICE VISIT CHIEF COMPLAINT: Bulging vein on the left side of the forehead. Left-sided pulsatile tinnitus. Current Pain Level: 1-10 HISTORY OF PRESENT ILLNESS: Patient is a 30 year old right handed female referred for evaluation of left-sided pulsatile tinnitus associated with left facial pain and prominence of bulging vein overlying the left orbit. Patient is  accompanied by her mother. The patient reports a gradual increased prominence of a vein overlying the left lateral supraorbital ridge extending to the left side of face anteriorly. This has progressively enlarged over the past 2 years associated with pulsatile noise more prominent at nighttime laying on her left-side. There is no radiation of the pulsatility over the right side of the face. She reports decrease hearing on the right side with appreciable hearing loss on the left side. Patient also reports for the past few weeks on waking up in the morning, with haziness in the left eye which gradually clears over the next 4-6 hours when she is up and around. She denies any complete loss of vision or of amaurosis fugax. There is also mild retro-orbital discomfort with eye movement. Patient denies any chemosis or redness in the left eye. Past Medical History: Thyroid disease. Early COPD. No pertinent surgical history. Medications: According to the most recent ER visit albuterol inhaler and brompheniramine syrup. Allergies: Contrast dye.  Delayed generalized rash Social History: Works at [employer]. Smokes cigarettes every day. Denies use of alcohol or of illicit drugs. Family History:  Negative for pertinent history. REVIEW OF SYSTEMS: As above. PHYSICAL EXAMINATION: Appears in no acute distress. Affect appropriate. Normal eye contact. Neurologically intact. Prominent tortuous vascular structure noted overlying the left supraorbital area projecting posteriorly and inferiorly overlying the lateral wall of the orbit with palpable pulsatility and bruit. Otherwise, nonfocal neurological examination. ASSESSMENT AND PLAN: Patient's CT angiogram findings were reviewed with the patient and mother. Abnormal prominent vascularity is seen overlying the left temporal region extending into the roof of the supraorbital ridge. Intracranially vasculature appears grossly symmetrical. It was felt the vascular abnormality may  represent a posttraumatic fistulous communication given her past history of left orbital injury/fracture when she was 21-30 years old. The haziness in the left eye every morning may represent sequela of venous congestion related to the vascular malformation. Further evaluation with a formal catheter arteriogram would be appropriate to accurately evaluate the angio architecture of the vascular abnormality. The procedure, the reasons were discussed with the patient and the mother. Further management considerations would depend on the angiographic findings. The patient and the mother are agreeable to the diagnostic catheter arteriogram which will be scheduled as soon as possible. They both leave with good understanding and agreement with the above management plan. Electronically Signed   By: Julieanne Cotton M.D.   On: 05/22/2020 15:45    Labs:  CBC: Recent Labs    11/08/19 1219 05/15/20 1131  WBC 7.5 6.6  HGB 13.1 14.5  HCT 39.4 42.2  PLT 212 205    COAGS: No results for input(s): INR, APTT in the last 8760 hours.  BMP: Recent Labs    11/08/19 1219 05/15/20 1131  NA 137 134*  K 4.0 3.9  CL 102 102  CO2 27 22  GLUCOSE 71 123*  BUN 19 21*  CALCIUM 9.1 10.0  CREATININE 0.91 0.95  GFRNONAA >60 >60     Assessment and Plan:  Near-syncope episodes, and intermittent left-eye blurred vision in setting of vascular malformation of  left temporal scalp. Plan for image-guided diagnostic cerebral arteriogram today in IR. Patient is NPO. Afebrile. She does not take blood thinners. INR 1.0 today. IV Benadryl 50 mg ordered per Dr. Corliss Skainseveshwar.  Out of work note x 48 hours post-procedure written for/given to patient.  Risks and benefits of diagnostic angiogram were discussed with the patient including, but not limited to bleeding, infection, vascular injury, stroke, or contrast induced renal failure. This interventional procedure involves the use of X-rays and because of the nature of the  planned procedure, it is possible that we will have prolonged use of X-ray fluoroscopy. Potential radiation risks to you include (but are not limited to) the following: - A slightly elevated risk for cancer  several years later in life. This risk is typically less than 0.5% percent. This risk is low in comparison to the normal incidence of human cancer, which is 33% for women and 50% for men according to the American Cancer Society. - Radiation induced injury can include skin redness, resembling a rash, tissue breakdown / ulcers and hair loss (which can be temporary or permanent).  The likelihood of either of these occurring depends on the difficulty of the procedure and whether you are sensitive to radiation due to previous procedures, disease, or genetic conditions.  IF your procedure requires a prolonged use of radiation, you will be notified and given written instructions for further action.  It is your responsibility to monitor the irradiated area for the 2 weeks following the procedure and to notify your physician if you are concerned that you have suffered a radiation induced injury.   All of the patient's questions were answered, patient is agreeable to proceed. Consent signed and in chart.   Thank you for this interesting consult.  I greatly enjoyed meeting Roosvelt Maserrika Comley and look forward to participating in their care.  A copy of this report was sent to the requesting provider on this date.  Electronically Signed: Elwin MochaAlexandra Chevis Weisensel, PA-C 05/30/2020, 9:34 AM   I spent a total of 25 Minutes in face to face in clinical consultation, greater than 50% of which was counseling/coordinating care for vascular malformation/diagnostic cerebral arteriogram.

## 2020-05-30 NOTE — Discharge Instructions (Addendum)
Femoral Site Care This sheet gives you information about how to care for yourself after your procedure. Your health care provider may also give you more specific instructions. If you have problems or questions, contact your health care provider. What can I expect after the procedure? After the procedure, it is common to have:  Bruising that usually fades within 1-2 weeks.  Tenderness at the site. Follow these instructions at home: Wound care 1. May remove bandage after 24 hours. 2. Do not take baths, swim, or use a hot tub for 5 days. 3. You may shower 24-48 hours after the procedure. ? Gently wash the site with plain soap and water. ? Pat the area dry with a clean towel. ? Do not rub the site. This may cause bleeding. 4. Do not apply powder or lotion to the site. Keep the site clean and dry. 5. Check your femoral site every day for signs of infection. Check for: ? Redness, swelling, or pain. ? Fluid or blood. ? Warmth. ? Pus or a bad smell. Activity 1. For the first 2-3 days after your procedure, or as long as directed: ? Avoid climbing stairs as much as possible. ? Do not squat. 2. Do not lift, push or pull anything that is heavier than 10 lb for 5 days. 3. Rest as directed. ? Avoid sitting for a long time without moving. Get up to take short walks every 1-2 hours. 4. Do not drive for 24 hours. General instructions  Take over-the-counter and prescription medicines only as told by your health care provider.  Keep all follow-up visits as told by your health care provider. This is important.  DRINK PLENTY OF FLUIDS FOR THE NEXT 2-3 DAYS. Contact a health care provider if you have:  A fever or chills.  You have redness, swelling, or pain around your insertion site. Get help right away if:  The catheter insertion area swells very fast.  You pass out.  You suddenly start to sweat or your skin gets clammy.  The catheter insertion area is bleeding, and the bleeding does  not stop when you hold steady pressure on the area.  The area near or just beyond the catheter insertion site becomes pale, cool, tingly, or numb. These symptoms may represent a serious problem that is an emergency. Do not wait to see if the symptoms will go away. Get medical help right away. Call your local emergency services (911 in the [place] Do not drive yourself to the hospital. Summary  After the procedure, it is common to have bruising that usually fades within 1-2 weeks.  Check your femoral site every day for signs of infection.  Do not lift, push or pull anything that is heavier than 10 lb for 5 days.  This information is not intended to replace advice given to you by your health care provider. Make sure you discuss any questions you have with your health care provider. Document Revised: 01/25/2017 Document Reviewed: 01/25/2017 Elsevier Patient Education  2020 Elsevier Inc.   Moderate Conscious Sedation, Adult Sedation is the use of medicines to promote relaxation and to relieve discomfort and anxiety. Moderate conscious sedation is a type of sedation. Under moderate conscious sedation, you are less alert than normal, but you are still able to respond to instructions, touch, or both. Moderate conscious sedation is used during short medical and dental procedures. It is milder than deep sedation, which is a type of sedation under which you cannot be easily woken up. It is also   than general anesthesia, which is the use of medicines to make you unconscious. Moderate conscious sedation allows you to return to your regular activities sooner. Tell a health care provider about:  Any allergies you have.  All medicines you are taking, including vitamins, herbs, eye drops, creams, and over-the-counter medicines.  Any use of steroids. This includes steroids taken by mouth or as a cream.  Any problems you or family members have had with sedatives and anesthetic medicines.  Any blood  disorders you have.  Any surgeries you have had.  Any medical conditions you have, such as sleep apnea.  Whether you are pregnant or may be pregnant.  Any use of cigarettes, alcohol, marijuana, or drugs. What are the risks? Generally, this is a safe procedure. However, problems may occur, including:  Getting too much medicine (oversedation).  Nausea.  Allergic reaction to medicines.  Trouble breathing. If this happens, a breathing tube may be used. It will be removed when you are awake and breathing on your own.  Heart trouble.  Lung trouble.  Confusion that gets better with time (emergence delirium). What happens before the procedure? Staying hydrated Follow instructions from your health care provider about hydration, which may include:  Up to 2 hours before the procedure - you may continue to drink clear liquids, such as water, clear fruit juice, black coffee, and plain tea. Eating and drinking restrictions Follow instructions from your health care provider about eating and drinking, which may include:  8 hours before the procedure - stop eating heavy meals or foods, such as meat, fried foods, or fatty foods.  6 hours before the procedure - stop eating light meals or foods, such as toast or cereal.  6 hours before the procedure - stop drinking milk or drinks that contain milk.  2 hours before the procedure - stop drinking clear liquids. Medicines Ask your health care provider about:  Changing or stopping your regular medicines. This is especially important if you are taking diabetes medicines or blood thinners.  Taking medicines such as aspirin and ibuprofen. These medicines can thin your blood. Do not take these medicines unless your health care provider tells you to take them.  Taking over-the-counter medicines, vitamins, herbs, and supplements. Tests and exams  You will have a physical exam.  You may have blood tests done to show how well: ? Your kidneys and  liver work. ? Your blood clots. General instructions  Plan to have a responsible adult take you home from the hospital or clinic.  If you will be going home right after the procedure, plan to have a responsible adult care for you for the time you are told. This is important. What happens during the procedure?  You will be given the sedative. The sedative may be given: ? As a pill that you will swallow. It can also be inserted into the rectum. ? As a spray through the nose. ? As an injection into the muscle. ? As an injection into the vein through an IV.  You may be given oxygen as needed.  Your breathing, heart rate, and blood pressure will be monitored during the procedure.  The medical or dental procedure will be done. The procedure may vary among health care providers and hospitals.   What happens after the procedure?  Your blood pressure, heart rate, breathing rate, and blood oxygen level will be monitored until you leave the hospital or clinic.  You will get fluids through your IV if needed.  Do not  not drive or operate machinery until your health care provider says that it is safe. Summary  Sedation is the use of medicines to promote relaxation and to relieve discomfort and anxiety. Moderate conscious sedation is a type of sedation that is used during short medical and dental procedures.  Tell the health care provider about any medical conditions that you have and about all the medicines that you are taking.  You will be given the sedative as a pill, a spray through the nose, an injection into the muscle, or an injection into the vein through an IV. Vital signs are monitored during the sedation.  Moderate conscious sedation allows you to return to your regular activities sooner. This information is not intended to replace advice given to you by your health care provider. Make sure you discuss any questions you have with your health care provider. Document Revised: 05/12/2019  Document Reviewed: 12/08/2018 Elsevier Patient Education  2021 Elsevier Inc.  

## 2020-05-30 NOTE — Sedation Documentation (Addendum)
SBAR called to Lisa, RN.  

## 2020-07-03 ENCOUNTER — Other Ambulatory Visit (HOSPITAL_COMMUNITY): Payer: Self-pay | Admitting: Physician Assistant

## 2020-07-03 ENCOUNTER — Other Ambulatory Visit (HOSPITAL_COMMUNITY): Payer: Self-pay | Admitting: Interventional Radiology

## 2020-07-03 DIAGNOSIS — I671 Cerebral aneurysm, nonruptured: Secondary | ICD-10-CM

## 2020-07-03 MED ORDER — PREDNISONE 50 MG PO TABS: ORAL_TABLET | ORAL | 0 refills | Status: DC

## 2020-07-03 MED ORDER — DIPHENHYDRAMINE HCL 50 MG PO TABS: ORAL_TABLET | ORAL | 0 refills | Status: DC

## 2020-07-15 ENCOUNTER — Other Ambulatory Visit: Payer: Self-pay

## 2020-07-15 ENCOUNTER — Other Ambulatory Visit (HOSPITAL_COMMUNITY)
Admission: RE | Admit: 2020-07-15 | Discharge: 2020-07-15 | Disposition: A | Discharge status: Home / Self Care | Payer: Medicaid Other | Source: Ambulatory Visit | Attending: Interventional Radiology | Admitting: Interventional Radiology

## 2020-07-15 ENCOUNTER — Encounter (HOSPITAL_COMMUNITY): Payer: Self-pay | Admitting: Interventional Radiology

## 2020-07-15 DIAGNOSIS — Z01812 Encounter for preprocedural laboratory examination: Secondary | ICD-10-CM | POA: Insufficient documentation

## 2020-07-15 DIAGNOSIS — Z20822 Contact with and (suspected) exposure to covid-19: Secondary | ICD-10-CM | POA: Insufficient documentation

## 2020-07-15 LAB — SARS CORONAVIRUS 2 (TAT 6-24 HRS): SARS Coronavirus 2: NEGATIVE

## 2020-07-15 NOTE — Progress Notes (Signed)
PCP - denies Cardiologist - denies EKG - 05/15/20  COVID TEST- pending 6/20  Anesthesia review: n/a  -------------  SDW INSTRUCTIONS:  Your procedure is scheduled on 6/22 Wednesday. Please report to [facility] Main Entrance "A" at 06:30 A.M., and check in at the Admitting office. Call this number if you have problems the morning of surgery: [phone]   Remember: Do not eat or drink after midnight the night before your surgery  Medications to take morning of surgery with a sip of water include: acetaminophen (TYLENOL)  albuterol (VENTOLIN HFA) --- Please bring all inhalers with you the day of surgery.   As of today, STOP taking any Aspirin (unless otherwise instructed by your surgeon), Aleve, Naproxen, Ibuprofen, Motrin, Advil, Goody's, BC's, all herbal medications, fish oil, and all vitamins.    The Morning of Surgery Do not wear jewelry, make-up or nail polish. Do not wear lotions, powders, or perfumes, or deodorant Do not shave 48 hours prior to surgery.   Do not bring valuables to the hospital. [facility] is not responsible for any belongings or valuables.  If you are a smoker, DO NOT Smoke 24 hours prior to surgery If you wear a CPAP at night please bring your mask the morning of surgery  Remember that you must have someone to transport you home after your surgery, and remain with you for 24 hours if you are discharged the same day.  Please bring cases for contacts, glasses, hearing aids, dentures or bridgework because it cannot be worn into surgery.   Patients discharged the day of surgery will not be allowed to drive home.   Please shower the NIGHT BEFORE/MORNING OF SURGERY (use antibacterial soap like DIAL soap if possible). Wear comfortable clothes the morning of surgery. Oral Hygiene is also important to reduce your risk of infection.  Remember - BRUSH YOUR TEETH THE MORNING OF SURGERY WITH YOUR REGULAR TOOTHPASTE  Patient denies shortness of breath, fever, cough  and chest pain.

## 2020-07-16 ENCOUNTER — Other Ambulatory Visit: Payer: Self-pay | Admitting: Radiology

## 2020-07-17 ENCOUNTER — Encounter (HOSPITAL_COMMUNITY)
Admission: RE | Disposition: A | Discharge status: Home / Self Care | Payer: Self-pay | Source: Home / Self Care | Attending: Interventional Radiology

## 2020-07-17 ENCOUNTER — Ambulatory Visit (HOSPITAL_COMMUNITY): Payer: Self-pay | Admitting: Certified Registered Nurse Anesthetist

## 2020-07-17 ENCOUNTER — Observation Stay (HOSPITAL_BASED_OUTPATIENT_CLINIC_OR_DEPARTMENT_OTHER): Payer: Self-pay

## 2020-07-17 ENCOUNTER — Encounter (HOSPITAL_COMMUNITY): Payer: Self-pay

## 2020-07-17 ENCOUNTER — Encounter (HOSPITAL_COMMUNITY): Payer: Self-pay | Admitting: Interventional Radiology

## 2020-07-17 ENCOUNTER — Ambulatory Visit (HOSPITAL_COMMUNITY)
Admission: RE | Admit: 2020-07-17 | Discharge: 2020-07-17 | Disposition: A | Discharge status: Home / Self Care | Payer: Self-pay | Source: Ambulatory Visit | Attending: Interventional Radiology | Admitting: Interventional Radiology

## 2020-07-17 ENCOUNTER — Observation Stay (HOSPITAL_COMMUNITY)
Admission: RE | Admit: 2020-07-17 | Discharge: 2020-07-18 | Disposition: A | Discharge status: Home / Self Care | Payer: Self-pay | Attending: Interventional Radiology | Admitting: Interventional Radiology

## 2020-07-17 ENCOUNTER — Other Ambulatory Visit: Payer: Self-pay

## 2020-07-17 DIAGNOSIS — E079 Disorder of thyroid, unspecified: Secondary | ICD-10-CM | POA: Insufficient documentation

## 2020-07-17 DIAGNOSIS — F17210 Nicotine dependence, cigarettes, uncomplicated: Secondary | ICD-10-CM | POA: Insufficient documentation

## 2020-07-17 DIAGNOSIS — I671 Cerebral aneurysm, nonruptured: Secondary | ICD-10-CM

## 2020-07-17 DIAGNOSIS — Z91041 Radiographic dye allergy status: Secondary | ICD-10-CM | POA: Insufficient documentation

## 2020-07-17 DIAGNOSIS — I770 Arteriovenous fistula, acquired: Principal | ICD-10-CM | POA: Insufficient documentation

## 2020-07-17 HISTORY — PX: RADIOLOGY WITH ANESTHESIA: SHX6223

## 2020-07-17 HISTORY — PX: IR ANGIO INTRA EXTRACRAN SEL COM CAROTID INNOMINATE BILAT MOD SED: IMG5360

## 2020-07-17 HISTORY — PX: IR TRANSCATH/EMBOLIZ: IMG695

## 2020-07-17 LAB — BASIC METABOLIC PANEL
Anion gap: 9 (ref 5–15)
BUN: 11 mg/dL (ref 6–20)
CO2: 20 mmol/L — ABNORMAL LOW (ref 22–32)
Calcium: 8.9 mg/dL (ref 8.9–10.3)
Chloride: 105 mmol/L (ref 98–111)
Creatinine, Ser: 0.9 mg/dL (ref 0.44–1.00)
GFR, Estimated: >60 mL/min (ref >60)
Glucose, Bld: 92 mg/dL (ref 70–99)
Potassium: 3.6 mmol/L (ref 3.5–5.1)
Sodium: 134 mmol/L — ABNORMAL LOW (ref 135–145)

## 2020-07-17 LAB — CBC WITH DIFFERENTIAL/PLATELET
Abs Immature Granulocytes: 0.01 10*3/uL (ref 0.00–0.07)
Basophils Absolute: 0 10*3/uL (ref 0.0–0.1)
Basophils Relative: 0 %
Eosinophils Absolute: 0.2 10*3/uL (ref 0.0–0.5)
Eosinophils Relative: 3 %
HCT: 38 % (ref 36.0–46.0)
Hemoglobin: 13 g/dL (ref 12.0–15.0)
Immature Granulocytes: 0 %
Lymphocytes Relative: 29 %
Lymphs Abs: 1.7 10*3/uL (ref 0.7–4.0)
MCH: 32 pg (ref 26.0–34.0)
MCHC: 34.2 g/dL (ref 30.0–36.0)
MCV: 93.6 fL (ref 80.0–100.0)
Monocytes Absolute: 0.5 10*3/uL (ref 0.1–1.0)
Monocytes Relative: 9 %
Neutro Abs: 3.5 10*3/uL (ref 1.7–7.7)
Neutrophils Relative %: 59 %
Platelets: 199 10*3/uL (ref 150–400)
RBC: 4.06 MIL/uL (ref 3.87–5.11)
RDW: 12.1 % (ref 11.5–15.5)
WBC: 6 10*3/uL (ref 4.0–10.5)
nRBC: 0 % (ref 0.0–0.2)

## 2020-07-17 LAB — PROTIME-INR
INR: 1 (ref 0.8–1.2)
Prothrombin Time: 13.2 seconds (ref 11.4–15.2)

## 2020-07-17 LAB — HEPARIN LEVEL (UNFRACTIONATED): Heparin Unfractionated: 0.1 IU/mL — ABNORMAL LOW (ref 0.30–0.70)

## 2020-07-17 LAB — POCT ACTIVATED CLOTTING TIME: Activated Clotting Time: 208 seconds

## 2020-07-17 LAB — POCT PREGNANCY, URINE: Preg Test, Ur: NEGATIVE

## 2020-07-17 SURGERY — IR WITH ANESTHESIA
Anesthesia: General

## 2020-07-17 MED ORDER — KETOROLAC TROMETHAMINE 30 MG/ML IJ SOLN
INTRAMUSCULAR | Status: DC | PRN
  Administered 2020-07-17: 30 mg via INTRAVENOUS

## 2020-07-17 MED ORDER — NITROGLYCERIN 1 MG/10 ML FOR IR/CATH LAB
INTRA_ARTERIAL | Status: AC
  Filled 2020-07-17: qty 10

## 2020-07-17 MED ORDER — FENTANYL CITRATE (PF) 250 MCG/5ML IJ SOLN
INTRAMUSCULAR | Status: DC | PRN
  Administered 2020-07-17 (×2): 50 ug via INTRAVENOUS

## 2020-07-17 MED ORDER — SUGAMMADEX SODIUM 200 MG/2ML IV SOLN
INTRAVENOUS | Status: DC | PRN
  Administered 2020-07-17: 300 mg via INTRAVENOUS

## 2020-07-17 MED ORDER — PROMETHAZINE HCL 25 MG/ML IJ SOLN: 6.2500 mg | INTRAMUSCULAR | Status: DC | PRN

## 2020-07-17 MED ORDER — LIDOCAINE HCL 1 % IJ SOLN
INTRAMUSCULAR | Status: AC
  Filled 2020-07-17: qty 20

## 2020-07-17 MED ORDER — HEPARIN (PORCINE) 25000 UT/250ML-% IV SOLN
600.0000 [IU]/h | INTRAVENOUS | Status: AC
  Administered 2020-07-17: 550 [IU]/h via INTRAVENOUS
  Filled 2020-07-17: qty 250

## 2020-07-17 MED ORDER — ACETAMINOPHEN 160 MG/5ML PO SOLN: 650.0000 mg | ORAL | Status: DC | PRN

## 2020-07-17 MED ORDER — CHLORHEXIDINE GLUCONATE CLOTH 2 % EX PADS
6.0000 | MEDICATED_PAD | Freq: Every day | CUTANEOUS | Status: DC
  Administered 2020-07-17: 6 via TOPICAL

## 2020-07-17 MED ORDER — MIDAZOLAM HCL 2 MG/2ML IJ SOLN
INTRAMUSCULAR | Status: AC
  Filled 2020-07-17: qty 2

## 2020-07-17 MED ORDER — DIPHENHYDRAMINE HCL 50 MG/ML IJ SOLN
50.0000 mg | Freq: Once | INTRAMUSCULAR | Status: AC
  Filled 2020-07-17: qty 1

## 2020-07-17 MED ORDER — CEFAZOLIN SODIUM-DEXTROSE 2-4 GM/100ML-% IV SOLN
2.0000 g | INTRAVENOUS | Status: AC
  Administered 2020-07-17: 2 g via INTRAVENOUS
  Filled 2020-07-17 (×2): qty 100

## 2020-07-17 MED ORDER — SODIUM CHLORIDE 0.9 % IV SOLN: INTRAVENOUS | Status: DC

## 2020-07-17 MED ORDER — KETOROLAC TROMETHAMINE 30 MG/ML IJ SOLN
30.0000 mg | Freq: Four times a day (QID) | INTRAMUSCULAR | Status: DC | PRN
  Administered 2020-07-17: 30 mg via INTRAVENOUS
  Filled 2020-07-17: qty 1

## 2020-07-17 MED ORDER — VERAPAMIL HCL 2.5 MG/ML IV SOLN
INTRAVENOUS | Status: AC
  Filled 2020-07-17: qty 2

## 2020-07-17 MED ORDER — IOHEXOL 240 MG/ML SOLN: 150.0000 mL | Freq: Once | INTRAMUSCULAR | Status: DC | PRN

## 2020-07-17 MED ORDER — ONDANSETRON HCL 4 MG/2ML IJ SOLN: 4.0000 mg | Freq: Four times a day (QID) | INTRAMUSCULAR | Status: DC | PRN

## 2020-07-17 MED ORDER — FENTANYL CITRATE (PF) 100 MCG/2ML IJ SOLN: 25.0000 ug | INTRAMUSCULAR | Status: DC | PRN

## 2020-07-17 MED ORDER — CHLORHEXIDINE GLUCONATE 0.12 % MT SOLN
15.0000 mL | Freq: Once | OROMUCOSAL | Status: AC
  Administered 2020-07-17: 15 mL via OROMUCOSAL
  Filled 2020-07-17: qty 15

## 2020-07-17 MED ORDER — KETOROLAC TROMETHAMINE 30 MG/ML IJ SOLN
INTRAMUSCULAR | Status: AC
  Filled 2020-07-17: qty 1

## 2020-07-17 MED ORDER — LACTATED RINGERS IV SOLN: INTRAVENOUS | Status: DC

## 2020-07-17 MED ORDER — HEPARIN SODIUM (PORCINE) 1000 UNIT/ML IJ SOLN
INTRAMUSCULAR | Status: DC | PRN
  Administered 2020-07-17: 3000 [IU] via INTRAVENOUS

## 2020-07-17 MED ORDER — ACETAMINOPHEN 650 MG RE SUPP: 650.0000 mg | RECTAL | Status: DC | PRN

## 2020-07-17 MED ORDER — ORAL CARE MOUTH RINSE: 15.0000 mL | Freq: Once | OROMUCOSAL | Status: AC

## 2020-07-17 MED ORDER — LIDOCAINE 2% (20 MG/ML) 5 ML SYRINGE
INTRAMUSCULAR | Status: DC | PRN
  Administered 2020-07-17: 80 mg via INTRAVENOUS

## 2020-07-17 MED ORDER — NITROGLYCERIN 1 MG/10 ML FOR IR/CATH LAB
INTRA_ARTERIAL | Status: DC | PRN
  Administered 2020-07-17: 25 ug via INTRA_ARTERIAL
  Administered 2020-07-17: 12.5 ug via INTRA_ARTERIAL

## 2020-07-17 MED ORDER — ASPIRIN EC 325 MG PO TBEC
325.0000 mg | DELAYED_RELEASE_TABLET | ORAL | Status: AC
  Administered 2020-07-17: 325 mg via ORAL
  Filled 2020-07-17 (×2): qty 1

## 2020-07-17 MED ORDER — PHENYLEPHRINE 40 MCG/ML (10ML) SYRINGE FOR IV PUSH (FOR BLOOD PRESSURE SUPPORT)
PREFILLED_SYRINGE | INTRAVENOUS | Status: DC | PRN
  Administered 2020-07-17: 80 ug via INTRAVENOUS

## 2020-07-17 MED ORDER — DIPHENHYDRAMINE HCL 50 MG/ML IJ SOLN
INTRAMUSCULAR | Status: AC
  Administered 2020-07-17: 50 mg via INTRAVENOUS
  Filled 2020-07-17: qty 1

## 2020-07-17 MED ORDER — HEPARIN SODIUM (PORCINE) 1000 UNIT/ML IJ SOLN
INTRAMUSCULAR | Status: AC
  Filled 2020-07-17: qty 1

## 2020-07-17 MED ORDER — PHENYLEPHRINE HCL-NACL 10-0.9 MG/250ML-% IV SOLN
INTRAVENOUS | Status: DC | PRN
  Administered 2020-07-17: 20 ug/min via INTRAVENOUS

## 2020-07-17 MED ORDER — PROPOFOL 10 MG/ML IV BOLUS
INTRAVENOUS | Status: DC | PRN
  Administered 2020-07-17: 100 mg via INTRAVENOUS

## 2020-07-17 MED ORDER — MIDAZOLAM HCL 2 MG/2ML IJ SOLN
INTRAMUSCULAR | Status: DC | PRN
  Administered 2020-07-17: 2 mg via INTRAVENOUS

## 2020-07-17 MED ORDER — LACTATED RINGERS IV SOLN: INTRAVENOUS | Status: DC | PRN

## 2020-07-17 MED ORDER — ONDANSETRON HCL 4 MG/2ML IJ SOLN
INTRAMUSCULAR | Status: DC | PRN
  Administered 2020-07-17: 4 mg via INTRAVENOUS

## 2020-07-17 MED ORDER — FENTANYL CITRATE (PF) 100 MCG/2ML IJ SOLN
INTRAMUSCULAR | Status: AC
  Filled 2020-07-17: qty 2

## 2020-07-17 MED ORDER — ROCURONIUM BROMIDE 10 MG/ML (PF) SYRINGE
PREFILLED_SYRINGE | INTRAVENOUS | Status: DC | PRN
  Administered 2020-07-17: 40 mg via INTRAVENOUS
  Administered 2020-07-17 (×2): 50 mg via INTRAVENOUS

## 2020-07-17 MED ORDER — IOHEXOL 240 MG/ML SOLN
INTRAMUSCULAR | Status: AC
  Filled 2020-07-17: qty 200

## 2020-07-17 MED ORDER — DEXAMETHASONE SODIUM PHOSPHATE 10 MG/ML IJ SOLN
INTRAMUSCULAR | Status: DC | PRN
  Administered 2020-07-17: 8 mg via INTRAVENOUS

## 2020-07-17 MED ORDER — CLEVIDIPINE BUTYRATE 0.5 MG/ML IV EMUL: 1.0000 mg/h | INTRAVENOUS | Status: DC

## 2020-07-17 MED ORDER — IOHEXOL 300 MG/ML  SOLN
150.0000 mL | Freq: Once | INTRAMUSCULAR | Status: AC | PRN
  Administered 2020-07-17: 66 mL via INTRAVENOUS

## 2020-07-17 MED ORDER — HEPARIN (PORCINE) 25000 UT/250ML-% IV SOLN
500.0000 [IU]/h | INTRAVENOUS | Status: DC
  Administered 2020-07-17: 500 [IU]/h via INTRAVENOUS
  Filled 2020-07-17: qty 250

## 2020-07-17 MED ORDER — ALBUTEROL SULFATE (2.5 MG/3ML) 0.083% IN NEBU: 2.5000 mg | INHALATION_SOLUTION | RESPIRATORY_TRACT | Status: DC | PRN

## 2020-07-17 MED ORDER — IOHEXOL 240 MG/ML SOLN
150.0000 mL | Freq: Once | INTRAMUSCULAR | Status: AC | PRN
  Administered 2020-07-17: 60 mL via INTRAVENOUS

## 2020-07-17 MED ORDER — ALBUTEROL SULFATE HFA 108 (90 BASE) MCG/ACT IN AERS: 2.0000 | INHALATION_SPRAY | RESPIRATORY_TRACT | Status: DC | PRN

## 2020-07-17 MED ORDER — ACETAMINOPHEN 325 MG PO TABS: 650.0000 mg | ORAL_TABLET | ORAL | Status: DC | PRN

## 2020-07-17 NOTE — H&P (Deleted)
  The note originally documented on this encounter has been moved the the encounter in which it belongs.  

## 2020-07-17 NOTE — Procedures (Addendum)
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 Post CT No ICH .  Extubated.  Denies any H/As,N/V or visual blurring or  left periorbital pain. Oriented to place and space.  Pupils 3 mm Rt = Lt EOMS full. No facial asymmetry Tongue midline. Moves all 4s equally  33F angioseal for RT CFA hemostasis. Distal pulses present bilaterally. S.Lavena Loretto MD.

## 2020-07-17 NOTE — Progress Notes (Signed)
NIR.  Patient underwent an image-guided cerebral arteriogram with embolization of left lateral periorbital AVF using onyx 18 via left anterior temporal branch x2 via right femoral approach this AM by Dr. Corliss Skains.  Patient evaluated bedside alongside Dr. Corliss Skains in neuro ICU. Patient awake and alert laying in bed. Complains of "soreness" of right femoral puncture site- VAS [place] negative for pseudoaneurysm/AVF/DVT. No additional complaints. Mother and RN at bedside.  Alert, awake, and oriented x3. Speech and comprehension intact. PERRL bilaterally. EOMs intact bilaterally without nystagmus or subjective diplopia. No facial asymmetry. Tongue midline. Can spontaneously move all extremities. No pronator drift. Distal pulses (DPs) palpable bilaterally. Right femoral puncture site soft without active bleeding or hematoma.  Plan to stay in neuro ICU for overnight observation. Toradol 30 mg IV Q6 PRN. Advance diet as tolerated. HOB flat x4 hours post-procedure, encourage to keep right leg straight until 0700 tomorrow. D/C Heparin gtt at 0700 tomorrow. SBP goal = 120-140. NIR to follow.   Waylan Boga Jelitza Manninen, PA-C 07/17/2020, 2:42 PM

## 2020-07-17 NOTE — Anesthesia Preprocedure Evaluation (Signed)
Anesthesia Evaluation  Patient identified by MRN, date of birth, ID band Patient awake    Reviewed: Allergy & Precautions, NPO status , Patient's Chart, lab work & pertinent test results  Airway Mallampati: II  TM Distance: >3 FB Neck ROM: Full    Dental  (+) Teeth Intact, Dental Advisory Given   Pulmonary Current Smoker,    Pulmonary exam normal breath sounds clear to auscultation       Cardiovascular negative cardio ROS Normal cardiovascular exam Rhythm:Regular Rate:Normal     Neuro/Psych DURAL ARTERIAL VENUS FISTULA negative psych ROS   GI/Hepatic negative GI ROS, Neg liver ROS,   Endo/Other  negative endocrine ROS  Renal/GU negative Renal ROS     Musculoskeletal negative musculoskeletal ROS (+)   Abdominal   Peds  Hematology negative hematology ROS (+)   Anesthesia Other Findings Day of surgery medications reviewed with the patient.  Reproductive/Obstetrics                             Anesthesia Physical Anesthesia Plan  ASA: 3  Anesthesia Plan: General   Post-op Pain Management:    Induction: Intravenous  PONV Risk Score and Plan: 2 and Dexamethasone and Ondansetron  Airway Management Planned: Oral ETT  Additional Equipment: Arterial line  Intra-op Plan:   Post-operative Plan: Extubation in OR  Informed Consent: I have reviewed the patients History and Physical, chart, labs and discussed the procedure including the risks, benefits and alternatives for the proposed anesthesia with the patient or authorized representative who has indicated his/her understanding and acceptance.     Dental advisory given  Plan Discussed with: CRNA  Anesthesia Plan Comments: (2nd PIV)        Anesthesia Quick Evaluation

## 2020-07-17 NOTE — Anesthesia Procedure Notes (Signed)
Procedure Name: Intubation Date/Time: 07/17/2020 8:38 AM Performed by: Inda Coke, CRNA Pre-anesthesia Checklist: Patient identified, Emergency Drugs available, Suction available and Patient being monitored Patient Re-evaluated:Patient Re-evaluated prior to induction Oxygen Delivery Method: Circle System Utilized Preoxygenation: Pre-oxygenation with 100% oxygen Induction Type: IV induction Ventilation: Mask ventilation without difficulty Laryngoscope Size: Mac and 3 Tube type: Oral Tube size: 7.0 mm Number of attempts: 1 Airway Equipment and Method: Stylet and Oral airway Placement Confirmation: ETT inserted through vocal cords under direct vision, positive ETCO2 and breath sounds checked- equal and bilateral Secured at: 21 cm Tube secured with: Tape Dental Injury: Teeth and Oropharynx as per pre-operative assessment

## 2020-07-17 NOTE — Progress Notes (Signed)
Limited right lower extremity arterial duplex completed. Refer to "CV Proc" under chart review to view preliminary results.  07/17/2020 1:54 PM Eula Fried., MHA, RVT, RDCS, RDMS

## 2020-07-17 NOTE — Progress Notes (Signed)
Patient transported to PACU with CRNA. Report given to receiving PACU RN. Groin site and pulses checked with PACU, RN. PACU nurse Marijean Niemann, informed of swollen bruised area around left temporal region. RN also informed that Dr. Corliss Skains is aware of swelling.

## 2020-07-17 NOTE — Sedation Documentation (Addendum)
Dr. Corliss Skains made aware of swelling around left temporal region of patient face. Area circular shaped and purplish in color. Dr. Corliss Skains at beside to assess area. IV toradol given by CRNA per Dr. Corliss Skains to help with swelling. Will continue to monitor area.

## 2020-07-17 NOTE — Anesthesia Procedure Notes (Signed)
Arterial Line Insertion Start/End6/22/2022 7:50 AM, 07/17/2020 7:52 AM Performed by: Epifanio Lesches, CRNA, CRNA  Patient location: Pre-op. Preanesthetic checklist: patient identified, IV checked, site marked, risks and benefits discussed, surgical consent, monitors and equipment checked, pre-op evaluation, timeout performed and anesthesia consent Lidocaine 1% used for infiltration Left, radial was placed Catheter size: 20 G Hand hygiene performed  and maximum sterile barriers used  Allen's test indicative of satisfactory collateral circulation Attempts: 2 Procedure performed without using ultrasound guided technique. Following insertion, dressing applied and Biopatch. Post procedure assessment: normal  Patient tolerated the procedure well with no immediate complications.

## 2020-07-17 NOTE — Transfer of Care (Signed)
Immediate Anesthesia Transfer of Care Note  Patient: [name]  Procedure(s) Performed: IR WITH ANESTHESIA EMBOLIZATION  Patient Location: PACU  Anesthesia Type:General  Level of Consciousness: awake, alert  and oriented  Airway & Oxygen Therapy: Patient Spontanous Breathing and Patient connected to face mask oxygen  Post-op Assessment: Report given to RN, Post -op Vital signs reviewed and stable and Patient moving all extremities  Post vital signs: Reviewed and stable  Last Vitals:  Vitals Value Taken Time  BP    Temp    Pulse 71 07/17/20 1200  Resp 9 07/17/20 1200  SpO2 100 % 07/17/20 1200  Vitals shown include unvalidated device data.  Last Pain:  Vitals:   07/17/20 0657  TempSrc:   PainSc: 0-No pain      Patients Stated Pain Goal: 3 (07/17/20 0657)  Complications: No notable events documented.

## 2020-07-17 NOTE — Progress Notes (Signed)
ANTICOAGULATION CONSULT NOTE - Initial Consult  Pharmacy Consult for Heparin Indication: Post Neuro IR  Allergies  Allergen Reactions   Contrast Media [Iodinated Diagnostic Agents] Hives    Patient Measurements: Height: 5\' 1"  (154.9 cm) Weight: 59 kg (130 lb) IBW/kg (Calculated) : 47.8 Heparin Dosing Weight: 59 kg   Vital Signs: Temp: 98.3 F (36.8 C) (06/22 1200) Temp Source: Oral (06/22 0616) BP: 114/68 (06/22 1230) Pulse Rate: 78 (06/22 1230)  Labs: Recent Labs    07/17/20 0633  HGB 13.0  HCT 38.0  PLT 199  LABPROT 13.2  INR 1.0  CREATININE 0.90    Estimated Creatinine Clearance: 74.1 mL/min (by C-G formula based on SCr of 0.9 mg/dL).   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 Pharmacy consulted to dose heparin for post IR procedure   Goal of Therapy:  Heparin level 0.1 - 0.25  Monitor platelets by anticoagulation protocol: Yes   Plan:  Continue heparin 500 units/hr (~8.5 units/kg) Check 6 hr heparin level  Stop heparin at 0700 on 6/23 per Dr. 7/23, PharmD Clinical Pharmacist  07/17/2020,1:28 PM

## 2020-07-17 NOTE — Progress Notes (Signed)
ANTICOAGULATION CONSULT NOTE - Follow-Up Consult  Pharmacy Consult for Heparin Indication: Post Neuro IR  Allergies  Allergen Reactions   Contrast Media [Iodinated Diagnostic Agents] Hives    Patient Measurements: Height: 5\' 1"  (154.9 cm) Weight: 59 kg (130 lb) IBW/kg (Calculated) : 47.8 Heparin Dosing Weight: 59 kg   Vital Signs: Temp: 99 F (37.2 C) (06/22 1600) Temp Source: Oral (06/22 1600) BP: 107/62 (06/22 2000) Pulse Rate: 86 (06/22 2000)  Labs: Recent Labs    07/17/20 0633 07/17/20 1934  HGB 13.0  --   HCT 38.0  --   PLT 199  --   LABPROT 13.2  --   INR 1.0  --   HEPARINUNFRC  --  0.10*  CREATININE 0.90  --     Estimated Creatinine Clearance: 74.1 mL/min (by C-G formula based on SCr of 0.9 mg/dL).   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  Pharmacy consulted to dose heparin for post IR procedure.  Initial heparin level ~6 hrs after initiating heparin infusion at 500 units/hr, was 0.10 units/ml, which is at the low end of the goal range for this pt. H/H 13.0/38.0, plt 199. Per RN, no issues with IV or bleeding observed.  Goal of Therapy:  Heparin level 0.1-0.25 units/ml Monitor platelets by anticoagulation protocol: Yes   Plan:  Increase heparin infusion to 550 units/hr  Check 6-hr heparin level  Monitor CBC Stop heparin at 0700 on 6/23 per Dr. 7/23, PharmD, BCPS, [facility] Clinical Pharmacist 07/17/2020,8:07 PM

## 2020-07-17 NOTE — Progress Notes (Signed)
Dr Corliss Skains at bedside, aware heaprin is delayed from pharmacy. Related will do [place] of l groin but can be done in ICU if unit is ready/unit IS ready.

## 2020-07-17 NOTE — Sedation Documentation (Signed)
Right groin sheath removed, 63fr angioseal closure device deployed @ 1120am.

## 2020-07-17 NOTE — H&P (Signed)
Chief Complaint: Patient was seen in consultation today for left supraorbital AV fistula  Supervising Physician: Julieanne Cotton  Patient Status: [facility] - Out-pt  History of Present Illness: Stephanie Hartman is a 30 y.o. female with past medical history of thyroid disease with near syncope events.  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  She was seen in consultation with Dr. Corliss Skains 05/22/20 to discuss and subsequent diganostic angiogram 05/30/20 showed a left supraorbital AV fistula fed by abnormally prominent left superficial temporal branches draining into the left external jugular vein.  Findings were discussed with the patient elects to proceed with intervention.   Stephanie Hartman presents for procedure today.  She is understanding of the goals of the procedure.  Unfortunately, she did not take her contrast premedication today.  She was pre-treated with IV benadryl prior to diagnostic angiogram.  After review of allergy and symptoms with patient, decision made to proceed with IV benadryl again today.  She has been NPO.  She reports she does vape 10 times daily, does not take illicit drugs, and does not drink alcohol.  She reports blurry vision in hte left eye today, stable vision in right eye. Headache yesterday, but none today. Has a "whoosing" sound in her left ear "sounds like a river".  Past Medical History:  Diagnosis Date   Thyroid disease     Past Surgical History:  Procedure Laterality Date   FRACTURE SURGERY Right 1997   "knee surgery where they took a foreign body out, but I can't completely remember what it was"   IR ANGIO INTRA EXTRACRAN SEL COM CAROTID INNOMINATE BILAT MOD SED  05/30/2020   IR ANGIO VERTEBRAL SEL SUBCLAVIAN INNOMINATE BILAT MOD SED  05/30/2020   IR RADIOLOGIST EVAL & MGMT  05/23/2020   TUBAL LIGATION      Allergies: Contrast media [iodinated diagnostic  agents]  Medications: Prior to Admission medications   Medication Sig Start Date End Date Taking? Authorizing Provider  acetaminophen (TYLENOL) 500 MG tablet Take 2,000 mg by mouth daily as needed for moderate pain.   Yes [provider]  albuterol (VENTOLIN HFA) 108 (90 Base) MCG/ACT inhaler Inhale 2 puffs into the lungs every 4 (four) hours as needed for wheezing or shortness of breath. 11/08/19  Yes Shaune Pollack, MD  aspirin-acetaminophen-caffeine (EXCEDRIN MIGRAINE) [phone] MG tablet Take 4 tablets by mouth 3 (three) times daily as needed for headache.   Yes [provider]  doxylamine, Sleep, (SLEEP-AID) 25 MG tablet Take 25-125 mg by mouth at bedtime as needed for sleep.   Yes [provider]  Menthol, Topical Analgesic, (ZIMS MAX-FREEZE EX) Apply 1 application topically daily as needed (back pain).   Yes [provider]  naphazoline-pheniramine (VISINE) 0.025-0.3 % ophthalmic solution Place 1 drop into both eyes daily as needed for eye irritation.   Yes [provider]  diphenhydrAMINE (BENADRYL) 50 MG tablet Take 1 tablet by mouth 1 hour prior to procedure. Patient not taking: Reported on 07/11/2020 07/03/20   Lynnette Caffey A, PA-C  predniSONE (DELTASONE) 50 MG tablet Take 1 tablet by mouth 13 hours, 7 hours and 1 hour prior to procedure. Patient not taking: Reported on 07/11/2020 07/03/20   Villa Herb, PA-C     History reviewed. No pertinent family history.  Social History   Socioeconomic History   Marital status: Single    Spouse name: Not on file   Number of  children: Not on file   Years of education: Not on file   Highest education level: Not on file  Occupational History   Not on file  Tobacco Use   Smoking status: Every Day    Packs/day: 0.50    Pack years: 0.00    Types: Cigarettes   Smokeless tobacco: Never  Vaping Use   Vaping Use: Every day  Substance and Sexual Activity   Alcohol use: Not Currently    Drug use: Yes    Types: Marijuana    Comment: "2-3x/day" - 07/15/20   Sexual activity: Not on file  Other Topics Concern   Not on file  Social History Narrative   Not on file   Social Determinants of Health   Financial Resource Strain: Not on file  Food Insecurity: Not on file  Transportation Needs: Not on file  Physical Activity: Not on file  Stress: Not on file  Social Connections: Not on file     Review of Systems: A 12 point ROS discussed and pertinent positives are indicated in the HPI above.  All other systems are negative.  Review of Systems  Constitutional:  Negative for fatigue and fever.  Respiratory:  Negative for cough and shortness of breath.   Cardiovascular:  Negative for chest pain.  Gastrointestinal:  Negative for abdominal pain.  Musculoskeletal:  Negative for back pain.  Psychiatric/Behavioral:  Negative for behavioral problems and confusion.    Vital Signs: There were no vitals taken for this visit.  Physical Exam Vitals and nursing note reviewed.  Constitutional:      General: She is not in acute distress.    Appearance: Normal appearance. She is not ill-appearing.  HENT:     Mouth/Throat:     Mouth: Mucous membranes are moist.     Pharynx: Oropharynx is clear.  Cardiovascular:     Rate and Rhythm: Normal rate and regular rhythm.  Pulmonary:     Effort: Pulmonary effort is normal.     Breath sounds: Normal breath sounds.  Skin:    General: Skin is warm and dry.  Neurological:     General: No focal deficit present.     Mental Status: She is alert and oriented to person, place, and time. Mental status is at baseline.  Psychiatric:        Mood and Affect: Mood normal.        Behavior: Behavior normal.        Thought Content: Thought content normal.        Judgment: Judgment normal.     MD Evaluation Airway: WNL Heart: WNL Abdomen: WNL Chest/ Lungs: WNL ASA  Classification: 3 Mallampati/Airway Score: Two   Imaging: No results  found.  Labs:  CBC: Recent Labs    11/08/19 1219 05/15/20 1131 05/30/20 0907 07/17/20 0633  WBC 7.5 6.6 5.7 6.0  HGB 13.1 14.5 13.1 13.0  HCT 39.4 42.2 40.0 38.0  PLT 212 205 204 199    COAGS: Recent Labs    05/30/20 0907 07/17/20 0633  INR 1.0 1.0    BMP: Recent Labs    11/08/19 1219 05/15/20 1131 05/30/20 0907 07/17/20 0633  NA 137 134* 139 134*  K 4.0 3.9 4.0 3.6  CL 102 102 109 105  CO2 $Re'27 22 25 'Cso$ 20*  GLUCOSE 71 123* 86 92  BUN 19 21* 16 11  CALCIUM 9.1 10.0 8.7* 8.9  CREATININE 0.91 0.95 0.81 0.90  GFRNONAA >60 >60 >60 >60    LIVER FUNCTION TESTS:  No results for input(s): BILITOT, AST, ALT, ALKPHOS, PROT, ALBUMIN in the last 8760 hours.  TUMOR MARKERS: No results for input(s): AFPTM, CEA, CA199, CHROMGRNA in the last 8760 hours.  Assessment and Plan: Patient with past medical history of headaches, near syncope presents with complaint of L supraorbital AV fistula.   Case reviewed by Dr. Estanislado Pandy who approves patient for procedure and has met with the patient in consultation to discuss procedure.   Patient presents today in their usual state of health. She appears anxious, but with stable neuro-related symptoms.  She has been NPO and is not currently on blood thinners.   Risks and benefits of cerebral angiogram with intervention were discussed with the patient including, but not limited to bleeding, infection, vascular injury, contrast induced renal failure, stroke or even death.  This interventional procedure involves the use of X-rays and because of the nature of the planned procedure, it is possible that we will have prolonged use of X-ray fluoroscopy.  Potential radiation risks to you include (but are not limited to) the following: - A slightly elevated risk for cancer  several years later in life. This risk is typically less than 0.5% percent. This risk is low in comparison to the normal incidence of human cancer, which is 33% for women and 50%  for men according to the [place]. - Radiation induced injury can include skin redness, resembling a rash, tissue breakdown / ulcers and hair loss (which can be temporary or permanent).   The likelihood of either of these occurring depends on the difficulty of the procedure and whether you are sensitive to radiation due to previous procedures, disease, or genetic conditions.   IF your procedure requires a prolonged use of radiation, you will be notified and given written instructions for further action.  It is your responsibility to monitor the irradiated area for the 2 weeks following the procedure and to notify your physician if you are concerned that you have suffered a radiation induced injury.    All of the patient's questions were answered, patient is agreeable to proceed.  Consent signed and in chart.  Thank you for this interesting consult.  I greatly enjoyed meeting Stephanie Hartman and look forward to participating in their care.  A copy of this report was sent to the requesting provider on this date.  Electronically Signed: Docia Barrier, PA 07/17/2020, 8:26 AM   I spent a total of  30 Minutes   in face to face in clinical consultation, greater than 50% of which was counseling/coordinating care for left supraorbital AV fistula.

## 2020-07-18 ENCOUNTER — Encounter (HOSPITAL_COMMUNITY): Payer: Self-pay | Admitting: Interventional Radiology

## 2020-07-18 LAB — CBC WITH DIFFERENTIAL/PLATELET
Abs Immature Granulocytes: 0.02 10*3/uL (ref 0.00–0.07)
Basophils Absolute: 0 10*3/uL (ref 0.0–0.1)
Basophils Relative: 0 %
Eosinophils Absolute: 0 10*3/uL (ref 0.0–0.5)
Eosinophils Relative: 0 %
HCT: 31.2 % — ABNORMAL LOW (ref 36.0–46.0)
Hemoglobin: 10.7 g/dL — ABNORMAL LOW (ref 12.0–15.0)
Immature Granulocytes: 0 %
Lymphocytes Relative: 32 %
Lymphs Abs: 2.3 10*3/uL (ref 0.7–4.0)
MCH: 32.6 pg (ref 26.0–34.0)
MCHC: 34.3 g/dL (ref 30.0–36.0)
MCV: 95.1 fL (ref 80.0–100.0)
Monocytes Absolute: 0.6 10*3/uL (ref 0.1–1.0)
Monocytes Relative: 8 %
Neutro Abs: 4.3 10*3/uL (ref 1.7–7.7)
Neutrophils Relative %: 60 %
Platelets: 164 10*3/uL (ref 150–400)
RBC: 3.28 MIL/uL — ABNORMAL LOW (ref 3.87–5.11)
RDW: 12.2 % (ref 11.5–15.5)
WBC: 7.2 10*3/uL (ref 4.0–10.5)
nRBC: 0 % (ref 0.0–0.2)

## 2020-07-18 LAB — BASIC METABOLIC PANEL
Anion gap: 10 (ref 5–15)
BUN: 8 mg/dL (ref 6–20)
CO2: 18 mmol/L — ABNORMAL LOW (ref 22–32)
Calcium: 8.4 mg/dL — ABNORMAL LOW (ref 8.9–10.3)
Chloride: 113 mmol/L — ABNORMAL HIGH (ref 98–111)
Creatinine, Ser: 0.77 mg/dL (ref 0.44–1.00)
GFR, Estimated: >60 mL/min (ref >60)
Glucose, Bld: 98 mg/dL (ref 70–99)
Potassium: 3.2 mmol/L — ABNORMAL LOW (ref 3.5–5.1)
Sodium: 141 mmol/L (ref 135–145)

## 2020-07-18 LAB — HEPARIN LEVEL (UNFRACTIONATED): Heparin Unfractionated: <0.1 IU/mL — ABNORMAL LOW (ref 0.30–0.70)

## 2020-07-18 MED ORDER — ASPIRIN EC 81 MG PO TBEC
81.0000 mg | DELAYED_RELEASE_TABLET | Freq: Every day | ORAL | Status: DC
  Administered 2020-07-18: 81 mg via ORAL
  Filled 2020-07-18: qty 1

## 2020-07-18 NOTE — Plan of Care (Signed)
Pt will be discharging today and going home with her mother. Will continue to monitor Stephanie Shaggy RN

## 2020-07-18 NOTE — Progress Notes (Signed)
Discharge summary completed with patient and her mother. Patient transported to car.  Susie [employer]

## 2020-07-18 NOTE — Discharge Summary (Signed)
Patient ID: Stephanie Hartman MRN: [id number] DOB/AGE: Dec 31, 1990 30 y.o.  Admit date: 07/17/2020 Discharge date: 07/18/2020  Supervising Physician: Julieanne Cotton  Patient Status: [facility] - In-pt  Admission Diagnoses: Dural arteriovenous fistula  Discharge Diagnoses:  Active Problems:   DAVF (dural arteriovenous fistula)   Discharged Condition: good  Hospital Course: Stephanie Hartman presented to [facility] on 07/17/20 for a planned cerebral angiogram with intervention due to left supraorbital AV fistula. The procedure was performed without complication and patient was admitted for overnight observation.  This morning Stephanie Hartman feels very good, Stephanie Hartman was able to eat breakfast without any issues. Stephanie Hartman has some pain around her left eye but the eyeball itself is not painful and Stephanie Hartman has no vision changes. Stephanie Hartman no longer hears any sounds on the left side. Her right groin puncture site is sore but tolerable. 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  Plan: - D/c home today - Advil scheduled x 1 week, take lowest effective dose for swelling/pain - No stooping, bending, lifting or driving x 2 weeks. May return to work 1 week post procedure with no heavy lifting restrictions, may resume normal duties 2 weeks post procedure. - Follow up with Dr. Corliss Skains in 2 weeks [facility] scheduler will call with appointment date/time)  Consults: None  Significant Diagnostic Studies: VAS [place] GROIN PSEUDOANEURYSM  Result Date: 07/17/2020  ARTERIAL PSEUDOANEURYSM  Patient Name:  Stephanie Hartman  Date of Exam:   07/17/2020 Medical Rec #: [id number]      Accession #:    [id number] Date of Birth: 05/20/1990       Patient Gender: F Patient Age:   30Y Exam Location:   [facility] Procedure:      VAS [place] Bobetta Lime Referring Phys: [id number] Baird Lyons --------------------------------------------------------------------------------  Exam: Right groin Indications: Patient complains of groin pain. Comparison Study: No prior study Performing Technologist: Gertie Fey MHA, RDMS, RVT, RDCS  Examination Guidelines: A complete evaluation includes B-mode imaging, spectral Doppler, color Doppler, and power Doppler as needed of all accessible portions of each vessel. Bilateral testing is considered an integral part of a complete examination. Limited examinations for reoccurring indications may be performed as noted. +------------+----------+---------+------+----------+ Right DuplexPSV (cm/s)Waveform PlaqueComment(s) +------------+----------+---------+------+----------+ CFA            112    triphasic                 +------------+----------+---------+------+----------+ PFA             68    triphasic                 +------------+----------+---------+------+----------+ Prox SFA        96    triphasic                 +------------+----------+---------+------+----------+ Right Vein comments:Patent right CFV  Summary: No evidence of pseudoaneurysm, AVF or DVT    --------------------------------------------------------------------------------    Preliminary     Treatments: IV hydration  Discharge Exam: Blood pressure 107/72, pulse 79, temperature 98.2 F (36.8 C), resp. rate 19, height 5\' 1"  (1.549 m), weight 130 lb (59 kg), last menstrual period 05/31/2020, SpO2  100 %. Physical Exam Vitals and nursing note reviewed.  Constitutional:      General: Stephanie Hartman is not in acute distress. HENT:     Head: Normocephalic.     Comments: (+) bruising to left temple, slightly ttp (+) left facial swelling from orbit to jaw, non tender Cardiovascular:     Rate and Rhythm: Normal rate and regular rhythm.     Comments: (+) Right CFA puncture site  clean, dry, dressed appropriately without active bleeding or drainage. Moderately tender to deep palpation. Soft, non pulsatile. Pulmonary:     Effort: Pulmonary effort is normal.     Breath sounds: Normal breath sounds.  Abdominal:     General: There is no distension.     Palpations: Abdomen is soft.     Tenderness: There is no abdominal tenderness.  Neurological:     Mental Status: Stephanie Hartman is alert.  Alert, awake, and oriented x 4 Speech and comprehension in tact PERRL bilaterally EOMs without nystagmus or subjective diplopia. Visual fields in tact No facial asymmetry. Tongue midline Motor power full in all 4 extremities Fine motor and coordination in tact Gait not assessed Distal pulses palpable bilaterally   Disposition:    Allergies as of 07/18/2020       Reactions   Contrast Media [iodinated Diagnostic Agents] Hives        Medication List     STOP taking these medications    diphenhydrAMINE 50 MG tablet Commonly known as: BENADRYL   predniSONE 50 MG tablet Commonly known as: DELTASONE       TAKE these medications    acetaminophen 500 MG tablet Commonly known as: TYLENOL Take 2,000 mg by mouth daily as needed for moderate pain.   albuterol 108 (90 Base) MCG/ACT inhaler Commonly known as: VENTOLIN HFA Inhale 2 puffs into the lungs every 4 (four) hours as needed for wheezing or shortness of breath.   aspirin-acetaminophen-caffeine 250-250-65 MG tablet Commonly known as: EXCEDRIN MIGRAINE Take 4 tablets by mouth 3 (three) times daily as needed for headache.   Sleep-Aid 25 MG tablet Generic drug: doxylamine (Sleep) Take 25-125 mg by mouth at bedtime as needed for sleep.   Visine 0.025-0.3 % ophthalmic solution Generic drug: naphazoline-pheniramine Place 1 drop into both eyes daily as needed for eye irritation.   ZIMS MAX-FREEZE EX Apply 1 application topically daily as needed (back pain).          Electronically Signed: Villa Herb,  PA-C 07/18/2020, 9:02 AM   I have spent Less Than 30 Minutes discharging Stephanie Hartman.

## 2020-07-18 NOTE — Discharge Instructions (Signed)
Femoral Site Care This sheet gives you information about how to care for yourself after your procedure. Your health care provider may also give you more specific instructions. If you have problems or questions, contact your health care provider. What can I expect after the procedure? After the procedure, it is common to have: Bruising that usually fades within 1-2 weeks. Tenderness at the site. Follow these instructions at home: Wound care Follow instructions from your health care provider about how to take care of your insertion site. Make sure you: Wash your hands with soap and water before you change your bandage (dressing). If soap and water are not available, use hand sanitizer. Change your dressing as directed- pressure dressing removed 24 hours post-procedure (and switch for bandaid), bandaid removed 72 hours post-procedure Do not take baths, swim, or use a hot tub for 7 days post-procedure. You may shower 48 hours after the procedure or as told by your health care provider. Gently wash the site with plain soap and water. Pat the area dry with a clean towel. Do not rub the site. This may cause bleeding. Check your site every day for signs of infection. Check for: Redness, swelling, or pain. Fluid or blood. Warmth. Pus or a bad smell. Activity Do not stoop, bend, or lift anything that is heavier than 10 lb (4.5 kg) for 2 weeks post-procedure. Do not drive self for 2 weeks post-procedure. You may ride in a car as a passenger immediately. Contact a health care provider if you have: A fever or chills. You have redness, swelling, or pain around your insertion site. Get help right away if: The catheter insertion area swells very fast. You pass out. You suddenly start to sweat or your skin gets clammy. The catheter insertion area is bleeding, and the bleeding does not stop when you hold steady pressure on the area. The area near or just beyond the catheter insertion site becomes pale,  cool, tingly, or numb. These symptoms may represent a serious problem that is an emergency. Do not wait to see if the symptoms will go away. Get medical help right away. Call your local emergency services (911 in the [place] Do not drive yourself to the hospital.  This information is not intended to replace advice given to you by your health care provider. Make sure you discuss any questions you have with your health care provider. Document Revised: 01/25/2017 Document Reviewed: 01/25/2017 Elsevier Patient Education  2020 [employer].

## 2020-07-18 NOTE — Progress Notes (Signed)
ANTICOAGULATION CONSULT NOTE - Follow Up Consult  Pharmacy Consult for heparin Indication:  s/p cerebral arteriogram with embolization of left lateral periorbital AVF    Labs: Recent Labs    07/17/20 0633 07/17/20 1934 07/18/20 0152  HGB 13.0  --   --   HCT 38.0  --   --   PLT 199  --   --   LABPROT 13.2  --   --   INR 1.0  --   --   HEPARINUNFRC  --  0.10* <0.10*  CREATININE 0.90  --   --     Assessment: 30yo female subtherapeutic on heparin with lower heparin level despite increased rate; pt did receive heparin boluses in IR which could have affected first level; no gtt issues or signs of bleeding per RN.  Goal of Therapy:  aPTT 0.1-0.25 seconds   Plan:  Will increase heparin gtt by 1 unit/kg/hr to 600 units/hr until off in am.    Vernard Gambles, PharmD, BCPS  07/18/2020,2:44 AM

## 2020-07-19 ENCOUNTER — Telehealth: Payer: Self-pay | Admitting: Radiology

## 2020-07-19 NOTE — Anesthesia Postprocedure Evaluation (Signed)
Anesthesia Post Note  Patient: Stephanie Hartman  Procedure(s) Performed: IR WITH ANESTHESIA EMBOLIZATION     Patient location during evaluation: PACU Anesthesia Type: General Level of consciousness: awake and alert Pain management: pain level controlled Vital Signs Assessment: post-procedure vital signs reviewed and stable Respiratory status: spontaneous breathing, nonlabored ventilation, respiratory function stable and patient connected to nasal cannula oxygen Cardiovascular status: blood pressure returned to baseline and stable Postop Assessment: no apparent nausea or vomiting Anesthetic complications: no   No notable events documented.  Last Vitals:  Vitals:   07/18/20 0800 07/18/20 0900  BP: 107/72 111/82  Pulse: 79 79  Resp: 19 12  Temp: 36.8 C   SpO2: 100% 97%    Last Pain:  Vitals:   07/18/20 0800  TempSrc:   PainSc: 0-No pain                 Mellody Dance

## 2020-07-19 NOTE — Telephone Encounter (Signed)
Patient with history of symptomatic fast flow left lateral periorbital AV fistula with symptomatic pulsatile tinnitus and left sided temporal headaches. S/P   endovascular completer obliteration of the left lateral periorbital fistula with Onyx 18 liquid embolic material on 6.23.22 by Dr. Fatima Sanger. Patient was discharged on 6.23.22.  Patient called stating that she had a sudden onset of bruising that occurred today while sitting on her porch. She describes it as "purple." Ms Glore denies any neurological deficits, eye pain or vision difficulties. Per discharge dated 6.23.222 bruising noted on evaluation to left temple. Patient assured that this is an expected finding post procedure. Patient instructed to come to ED should any neurologically deficits, vision issues or eye pain. Patient verbalized understanding and is in agreement with plan of care.

## 2020-07-23 NOTE — Addendum Note (Signed)
Addendum  created 07/23/20 1202 by Mellody Dance, MD   Attestation recorded in Intraprocedure, Intraprocedure Attestations filed

## 2020-07-31 ENCOUNTER — Other Ambulatory Visit: Payer: Self-pay

## 2020-07-31 ENCOUNTER — Ambulatory Visit (HOSPITAL_COMMUNITY)
Admission: RE | Admit: 2020-07-31 | Discharge: 2020-07-31 | Disposition: A | Discharge status: Home / Self Care | Payer: Self-pay | Source: Ambulatory Visit | Attending: Physician Assistant | Admitting: Physician Assistant

## 2020-07-31 DIAGNOSIS — I671 Cerebral aneurysm, nonruptured: Secondary | ICD-10-CM

## 2020-08-05 HISTORY — PX: IR RADIOLOGIST EVAL & MGMT: IMG5224

## 2020-08-07 ENCOUNTER — Telehealth: Payer: Self-pay | Admitting: Student

## 2020-08-07 NOTE — Telephone Encounter (Signed)
Stephanie Hartman is s/p endovascular complete obliteration of the left lateral periorbital arteriovenous fistula with Onyx 18 liquid embolic material with Dr. Pamella Pert on 07/19/2020.  She had a 2-week follow-up visit with Dr. Corliss Skains on 08/05/2020, needs to obtain CT angiogram of head and neck in approximately 4 months.  She called NIR today due to persistent pain from left wrist arterial line site.  Patient was called back and birthday was verified.  Patient states that she continues to have pain in her left wrist, and has been taking ibuprofen with no relief. Pain is persistent 5 out of 10 point pain scale, worsens with lifting.  Patient states that the pain in the left wrist is interfering with her daily activity. She also reports " poking" sensation on temporal area, happens 2-3 times a day.  Denies other neurological symptoms such as blurry vision, vision change, or focal weakness.   Advised patient to seek medical help from either her PCP or urgent care regarding her left wrist pain as the pain has been persistent is not responding to NSAID.  Regarding the " poking" sensation on forehead, Dr. Corliss Skains recommends keep well hydrated, take scheduled Motrin or Tylenol  for 5 days.   Patient verbalized understanding.  Please call NIR for further questions and concerns.  Lynann Bologna Baleria Wyman PA-C 08/07/2020 4:36 PM

## 2020-09-04 ENCOUNTER — Other Ambulatory Visit (HOSPITAL_COMMUNITY): Payer: Self-pay | Admitting: Interventional Radiology

## 2020-09-04 DIAGNOSIS — I671 Cerebral aneurysm, nonruptured: Secondary | ICD-10-CM

## 2020-10-29 ENCOUNTER — Telehealth: Payer: Self-pay | Admitting: Student

## 2020-10-29 NOTE — Telephone Encounter (Signed)
Patient with a contrast allergy with CTA scheduled for 10/31/20. I called the patient to discuss the pre-procedure contrast allergy regimen of prednisone and benadryl. Patient states in the past she only just takes benadryl before the procedure. She said she will break out in a little rash but it's not bad. I offered to send in prescription for prednisone and she declined. I recommended she take 50 mg benadryl PO one hour prior to her CT scan. Patient verbalized understanding. She knows to call IR with any questions.   Alwyn Ren, [place] [phone] 10/29/2020, 2:35 PM

## 2020-10-31 ENCOUNTER — Ambulatory Visit (HOSPITAL_COMMUNITY)
Admission: RE | Admit: 2020-10-31 | Discharge: 2020-10-31 | Disposition: A | Discharge status: Home / Self Care | Payer: Self-pay | Source: Ambulatory Visit | Attending: Interventional Radiology | Admitting: Interventional Radiology

## 2020-10-31 ENCOUNTER — Other Ambulatory Visit: Payer: Self-pay

## 2020-10-31 DIAGNOSIS — I671 Cerebral aneurysm, nonruptured: Secondary | ICD-10-CM | POA: Insufficient documentation

## 2020-10-31 MED ORDER — IOHEXOL 350 MG/ML SOLN
75.0000 mL | Freq: Once | INTRAVENOUS | Status: AC | PRN
  Administered 2020-10-31: 75 mL via INTRAVENOUS

## 2020-11-05 ENCOUNTER — Telehealth (HOSPITAL_COMMUNITY): Payer: Self-pay

## 2020-11-05 NOTE — Telephone Encounter (Signed)
Pt agreed to f/u in 6 months with cta head/neck. AW 

## 2021-01-16 ENCOUNTER — Encounter: Payer: Self-pay | Admitting: Emergency Medicine

## 2021-01-16 ENCOUNTER — Emergency Department: Payer: Self-pay

## 2021-01-16 ENCOUNTER — Other Ambulatory Visit: Payer: Self-pay

## 2021-01-16 ENCOUNTER — Emergency Department
Admission: EM | Admit: 2021-01-16 | Discharge: 2021-01-16 | Disposition: A | Discharge status: Home / Self Care | Payer: Self-pay | Attending: Emergency Medicine | Admitting: Emergency Medicine

## 2021-01-16 DIAGNOSIS — R0789 Other chest pain: Secondary | ICD-10-CM

## 2021-01-16 DIAGNOSIS — R079 Chest pain, unspecified: Secondary | ICD-10-CM | POA: Insufficient documentation

## 2021-01-16 DIAGNOSIS — J209 Acute bronchitis, unspecified: Secondary | ICD-10-CM

## 2021-01-16 DIAGNOSIS — F17210 Nicotine dependence, cigarettes, uncomplicated: Secondary | ICD-10-CM | POA: Insufficient documentation

## 2021-01-16 DIAGNOSIS — N390 Urinary tract infection, site not specified: Secondary | ICD-10-CM

## 2021-01-16 DIAGNOSIS — R1011 Right upper quadrant pain: Secondary | ICD-10-CM

## 2021-01-16 DIAGNOSIS — Z20822 Contact with and (suspected) exposure to covid-19: Secondary | ICD-10-CM | POA: Insufficient documentation

## 2021-01-16 DIAGNOSIS — R748 Abnormal levels of other serum enzymes: Secondary | ICD-10-CM

## 2021-01-16 LAB — CBC WITH DIFFERENTIAL/PLATELET
Abs Immature Granulocytes: 0.02 10*3/uL (ref 0.00–0.07)
Basophils Absolute: 0 10*3/uL (ref 0.0–0.1)
Basophils Relative: 1 %
Eosinophils Absolute: 0.2 10*3/uL (ref 0.0–0.5)
Eosinophils Relative: 3 %
HCT: 42.2 % (ref 36.0–46.0)
Hemoglobin: 14.1 g/dL (ref 12.0–15.0)
Immature Granulocytes: 0 %
Lymphocytes Relative: 21 %
Lymphs Abs: 1.3 10*3/uL (ref 0.7–4.0)
MCH: 31.1 pg (ref 26.0–34.0)
MCHC: 33.4 g/dL (ref 30.0–36.0)
MCV: 93 fL (ref 80.0–100.0)
Monocytes Absolute: 0.5 10*3/uL (ref 0.1–1.0)
Monocytes Relative: 7 %
Neutro Abs: 4.1 10*3/uL (ref 1.7–7.7)
Neutrophils Relative %: 68 %
Platelets: 209 10*3/uL (ref 150–400)
RBC: 4.54 MIL/uL (ref 3.87–5.11)
RDW: 12.1 % (ref 11.5–15.5)
WBC: 6.2 10*3/uL (ref 4.0–10.5)
nRBC: 0 % (ref 0.0–0.2)

## 2021-01-16 LAB — COMPREHENSIVE METABOLIC PANEL
ALT: 18 U/L (ref 0–44)
AST: 20 U/L (ref 15–41)
Albumin: 4.3 g/dL (ref 3.5–5.0)
Alkaline Phosphatase: 53 U/L (ref 38–126)
Anion gap: 5 (ref 5–15)
BUN: 15 mg/dL (ref 6–20)
CO2: 26 mmol/L (ref 22–32)
Calcium: 9.1 mg/dL (ref 8.9–10.3)
Chloride: 104 mmol/L (ref 98–111)
Creatinine, Ser: 0.86 mg/dL (ref 0.44–1.00)
GFR, Estimated: >60 mL/min (ref >60)
Glucose, Bld: 114 mg/dL — ABNORMAL HIGH (ref 70–99)
Potassium: 4.3 mmol/L (ref 3.5–5.1)
Sodium: 135 mmol/L (ref 135–145)
Total Bilirubin: 0.7 mg/dL (ref 0.3–1.2)
Total Protein: 7.2 g/dL (ref 6.5–8.1)

## 2021-01-16 LAB — URINALYSIS, ROUTINE W REFLEX MICROSCOPIC
Bilirubin Urine: NEGATIVE
Glucose, UA: NEGATIVE mg/dL
Hgb urine dipstick: NEGATIVE
Ketones, ur: NEGATIVE mg/dL
Nitrite: NEGATIVE
Protein, ur: 30 mg/dL — AB
Specific Gravity, Urine: 1.025 (ref 1.005–1.030)
pH: 6 (ref 5.0–8.0)

## 2021-01-16 LAB — TROPONIN I (HIGH SENSITIVITY): Troponin I (High Sensitivity): 3 ng/L (ref <18)

## 2021-01-16 LAB — RESP PANEL BY RT-PCR (FLU A&B, COVID) ARPGX2
Influenza A by PCR: NEGATIVE
Influenza B by PCR: NEGATIVE
SARS Coronavirus 2 by RT PCR: NEGATIVE

## 2021-01-16 LAB — LIPASE, BLOOD: Lipase: 93 U/L — ABNORMAL HIGH (ref 11–51)

## 2021-01-16 MED ORDER — CEFDINIR 300 MG PO CAPS: 300.0000 mg | ORAL_CAPSULE | Freq: Two times a day (BID) | ORAL | 0 refills | Status: AC

## 2021-01-16 MED ORDER — ONDANSETRON 4 MG PO TBDP: 4.0000 mg | ORAL_TABLET | Freq: Three times a day (TID) | ORAL | 0 refills | Status: AC | PRN

## 2021-01-16 NOTE — Discharge Instructions (Signed)
Your lipase test was mildly elevated.  If you develop worsening right upper quadrant pain or vomiting please return the emergency department. Take the antibiotic for the UTI and sinus infection.  Zofran ODT 4 vomiting.

## 2021-01-16 NOTE — ED Provider Notes (Signed)
[facility] Emergency Department Provider Note  ____________________________________________   Event Date/Time   First MD Initiated Contact with Patient 01/16/21 [phone]     (approximate)  I have reviewed the triage vital signs and the nursing notes.   HISTORY  Chief Complaint Chest Pain and Cough (/)    HPI Stephanie Hartman is a 30 y.o. female presents emergency department complaining of chest pain for 3 days.  Pain is worse in the mornings and will cause her to vomit.  States that the vomit has blood in it.  When she blows her nose she also is getting a long string of blood but not streaks.  She denies fever or chills.  No shortness of breath.  No chance of pregnancy  Past Medical History:  Diagnosis Date   Thyroid disease     Patient Active Problem List   Diagnosis Date Noted   DAVF (dural arteriovenous fistula) 07/17/2020    Past Surgical History:  Procedure Laterality Date   FRACTURE SURGERY Right 1997   "knee surgery where they took a foreign body out, but I can't completely remember what it was"   IR ANGIO INTRA EXTRACRAN SEL COM CAROTID INNOMINATE BILAT MOD SED  05/30/2020   IR ANGIO INTRA EXTRACRAN SEL COM CAROTID INNOMINATE BILAT MOD SED  07/17/2020   IR ANGIO VERTEBRAL SEL SUBCLAVIAN INNOMINATE BILAT MOD SED  05/30/2020   IR RADIOLOGIST EVAL & MGMT  05/23/2020   IR RADIOLOGIST EVAL & MGMT  08/05/2020   IR TRANSCATH/EMBOLIZ  07/17/2020   RADIOLOGY WITH ANESTHESIA N/A 07/17/2020   Procedure: IR WITH ANESTHESIA EMBOLIZATION;  Surgeon: Julieanne Cotton, MD;  Location: [facility] OR;  Service: Radiology;  Laterality: N/A;   TUBAL LIGATION      Prior to Admission medications   Medication Sig Start Date End Date Taking? Authorizing Provider  acetaminophen (TYLENOL) 500 MG tablet Take 2,000 mg by mouth daily as needed for moderate pain.    [provider]  albuterol (VENTOLIN HFA) 108 (90 Base) MCG/ACT inhaler Inhale 2 puffs into the lungs every  4 (four) hours as needed for wheezing or shortness of breath. 11/08/19   Shaune Pollack, MD  aspirin-acetaminophen-caffeine (EXCEDRIN MIGRAINE) [phone] MG tablet Take 4 tablets by mouth 3 (three) times daily as needed for headache.    [provider]  doxylamine, Sleep, (SLEEP-AID) 25 MG tablet Take 25-125 mg by mouth at bedtime as needed for sleep.    [provider]  Menthol, Topical Analgesic, (ZIMS MAX-FREEZE EX) Apply 1 application topically daily as needed (back pain).    [provider]  naphazoline-pheniramine (VISINE) 0.025-0.3 % ophthalmic solution Place 1 drop into both eyes daily as needed for eye irritation.    [provider]    Allergies Contrast media [iodinated diagnostic agents]  No family history on file.  Social History Social History   Tobacco Use   Smoking status: Every Day    Packs/day: 0.50    Types: Cigarettes   Smokeless tobacco: Never  Vaping Use   Vaping Use: Every day  Substance Use Topics   Alcohol use: Not Currently   Drug use: Yes    Types: Marijuana    Comment: "2-3x/day" - 07/15/20    Review of Systems  Constitutional: No fever/chills Eyes: No visual changes. ENT: No sore throat. Respiratory: Positive cough Cardiovascular: Positive chest pain Gastrointestinal: Positive abdominal pain Genitourinary: Negative for dysuria. Musculoskeletal: Negative for back pain. Skin: Negative for rash. Psychiatric: no mood changes,  ____________________________________________   PHYSICAL EXAM:  VITAL SIGNS: ED Triage Vitals  Enc Vitals Group     BP 01/16/21 0758 105/79     Pulse Rate 01/16/21 0758 79     Resp 01/16/21 0758 17     Temp 01/16/21 0758 98.8 F (37.1 C)     Temp Source 01/16/21 0758 Oral     SpO2 01/16/21 0758 94 %     Weight 01/16/21 0741 130 lb 1.1 oz (59 kg)     Height 01/16/21 0741 5\' 1"  (1.549 m)     Head Circumference --      Peak Flow --      Pain Score --      Pain Loc --       Pain Edu? --      Excl. in GC? --     Constitutional: Alert and oriented. Well appearing and in no acute distress. Eyes: Conjunctivae are normal.  Head: Atraumatic. Nose: No congestion/rhinnorhea. Mouth/Throat: Mucous membranes are moist.   Neck:  supple no lymphadenopathy noted Cardiovascular: Normal rate, regular rhythm. Heart sounds are normal Respiratory: Normal respiratory effort.  No retractions, lungs c t a  Abd: soft tender in the right upper quadrant bs normal all 4 quad GU: deferred Musculoskeletal: FROM all extremities, warm and well perfused Neurologic:  Normal speech and language.  Skin:  Skin is warm, dry and intact. No rash noted. Psychiatric: Mood and affect are normal. Speech and behavior are normal.  ____________________________________________   LABS (all labs ordered are listed, but only abnormal results are displayed)  Labs Reviewed  RESP PANEL BY RT-PCR (FLU A&B, COVID) ARPGX2  COMPREHENSIVE METABOLIC PANEL  LIPASE, BLOOD  CBC WITH DIFFERENTIAL/PLATELET  URINALYSIS, ROUTINE W REFLEX MICROSCOPIC  TROPONIN I (HIGH SENSITIVITY)   ____________________________________________   ____________________________________________  RADIOLOGY  Ultrasound right upper quadrant  ____________________________________________   PROCEDURES  Procedure(s) performed: No  Procedures    ____________________________________________   INITIAL IMPRESSION / ASSESSMENT AND PLAN / ED COURSE  Pertinent labs & imaging results that were available during my care of the patient were reviewed by me and considered in my medical decision making (see chart for details).   The patient is 30 year old female presents emergency department with chest pain on the right side.  See HPI.  Physical exam shows the patient to be tender in the right upper quadrant, she does have some congestion and cough.  DDx: Acute cholecystitis, pancreatitis, CAP, COVID, influenza, SBO  Labs and  imaging ordered  Respiratory panel is negative, troponin is normal, lipase is mild elevation at 93, comprehensive metabolic panel is normal, there is no elevation in her LFTs, CBC is normal, urinalysis does have small amount of leuks with many bacteria  Chest x-ray reviewed by me confirming radiology to be normal Ultrasound right upper quadrant is negative for acute cholecystitis  EKG performed in triage shows normal sinus rhythm, see physician read  I do not feel the patient actually has pancreatitis as the elevation in her lipase is at 93 which is not severe.  I do feel this is from her EtOH use.  She states she quit drinking 2 days ago.  Explained that alcohol can elevate the lipase level.  She is to continue to refrain from EtOH intake.  Since her COVID and influenza test are negative, with the leuks in the urine we will treat her for sinus infection and UTI.  Omnicef would cover both entities.  She was given Omnicef 300 mg twice daily for  10 days.  Zofran ODT for nausea/vomiting.  She is to return emergency department worsening.  She was discharged in stable condition.  She was also given a work note  Stephanie Hartman was evaluated in Emergency Department on 01/16/2021 for the symptoms described in the history of present illness. She was evaluated in the context of the global COVID-19 pandemic, which necessitated consideration that the patient might be at risk for infection with the SARS-CoV-2 virus that causes COVID-19. Institutional protocols and algorithms that pertain to the evaluation of patients at risk for COVID-19 are in a state of rapid change based on information released by regulatory bodies including the CDC and federal and state organizations. These policies and algorithms were followed during the patient's care in the ED.    As part of my medical decision making, I reviewed the following data within the electronic MEDICAL RECORD NUMBER Nursing notes reviewed and incorporated, Labs reviewed ,  EKG interpreted NSR, Old chart reviewed, Radiograph reviewed , Notes from prior ED visits, and [place] Controlled Substance Database  ____________________________________________   FINAL CLINICAL IMPRESSION(S) / ED DIAGNOSES  Final diagnoses:  RUQ pain      NEW MEDICATIONS STARTED DURING THIS VISIT:  New Prescriptions   No medications on file     Note:  This document was prepared using Dragon voice recognition software and may include unintentional dictation errors.    Faythe Ghee, PA-C 01/16/21 1127    Delton Prairie, MD 01/16/21 [phone]

## 2021-01-16 NOTE — ED Triage Notes (Addendum)
Presents with a 3 day hx of chest pain  states pain is worse in the morning  and is intermittent  also has had cough  unsure of fever

## 2021-04-02 ENCOUNTER — Telehealth: Payer: Self-pay | Admitting: Internal Medicine

## 2021-04-02 ENCOUNTER — Other Ambulatory Visit (HOSPITAL_COMMUNITY): Payer: Self-pay | Admitting: Interventional Radiology

## 2021-04-02 DIAGNOSIS — I671 Cerebral aneurysm, nonruptured: Secondary | ICD-10-CM

## 2021-04-02 MED ORDER — PREDNISONE 50 MG PO TABS: 50.0000 mg | ORAL_TABLET | Freq: Four times a day (QID) | ORAL | 0 refills | Status: AC

## 2021-04-02 MED ORDER — DIPHENHYDRAMINE HCL 25 MG PO CAPS: 50.0000 mg | ORAL_CAPSULE | Freq: Once | ORAL | Status: AC

## 2021-04-02 MED ORDER — PREDNISONE 50 MG PO TABS: 50.0000 mg | ORAL_TABLET | Freq: Four times a day (QID) | ORAL | 0 refills | Status: DC

## 2021-04-02 NOTE — Progress Notes (Signed)
Pt pre-meds were sent electronically to [facility] on file for CT scan 04/08/21. Pt advised that her prescription has been ordered.  ? ? ?Alex Gardener, AGNP-BC ?04/02/2021, 4:46 PM ? ? ?

## 2021-04-02 NOTE — Progress Notes (Signed)
Pt pre-CT medications were sent to [facility] on file. Pt notified by phone that her medications are ready to be picked up.  ? ? ?Alex Gardener, AGNP-BC ?04/02/2021, 4:54 PM ? ? ?

## 2021-04-08 ENCOUNTER — Ambulatory Visit (HOSPITAL_COMMUNITY)
Admission: RE | Admit: 2021-04-08 | Discharge: 2021-04-08 | Disposition: A | Discharge status: Home / Self Care | Payer: Self-pay | Source: Ambulatory Visit | Attending: Interventional Radiology | Admitting: Interventional Radiology

## 2021-04-08 ENCOUNTER — Encounter (HOSPITAL_COMMUNITY): Payer: Self-pay

## 2021-04-08 ENCOUNTER — Ambulatory Visit (HOSPITAL_COMMUNITY): Payer: Medicaid Other

## 2021-04-08 ENCOUNTER — Other Ambulatory Visit: Payer: Self-pay

## 2021-04-08 DIAGNOSIS — I671 Cerebral aneurysm, nonruptured: Secondary | ICD-10-CM | POA: Insufficient documentation

## 2021-04-08 MED ORDER — IOHEXOL 350 MG/ML SOLN
75.0000 mL | Freq: Once | INTRAVENOUS | Status: AC | PRN
  Administered 2021-04-08: 75 mL via INTRAVENOUS

## 2021-04-08 MED ORDER — SODIUM CHLORIDE (PF) 0.9 % IJ SOLN
INTRAMUSCULAR | Status: AC
  Filled 2021-04-08: qty 50

## 2021-04-09 ENCOUNTER — Telehealth (HOSPITAL_COMMUNITY): Payer: Self-pay

## 2021-04-09 NOTE — Telephone Encounter (Signed)
Pt agreed to f/u in 2 years with a cta head and neck. She had questions about passing out and memory issues. I've sent a message to our PA to advise. AW  ?

## 2021-04-10 ENCOUNTER — Telehealth (HOSPITAL_COMMUNITY): Payer: Self-pay

## 2021-04-10 NOTE — Telephone Encounter (Signed)
Pt agreed to f/u in 2 years and f/u with neurology or PCP to be evaluated for her recent symptoms of memory loss and passing out per Dr Dr. Corliss Skains. AW  ?

## 2021-05-07 ENCOUNTER — Emergency Department: Payer: Medicaid Other

## 2021-05-07 ENCOUNTER — Emergency Department
Admission: EM | Admit: 2021-05-07 | Discharge: 2021-05-07 | Discharge status: Against Medical Advice | Payer: Medicaid Other | Attending: Emergency Medicine | Admitting: Emergency Medicine

## 2021-05-07 DIAGNOSIS — R079 Chest pain, unspecified: Secondary | ICD-10-CM | POA: Insufficient documentation

## 2021-05-07 DIAGNOSIS — Z5321 Procedure and treatment not carried out due to patient leaving prior to being seen by health care provider: Secondary | ICD-10-CM | POA: Insufficient documentation

## 2021-05-07 LAB — BASIC METABOLIC PANEL
Anion gap: 13 (ref 5–15)
BUN: 15 mg/dL (ref 6–20)
CO2: 25 mmol/L (ref 22–32)
Calcium: 9.8 mg/dL (ref 8.9–10.3)
Chloride: 98 mmol/L (ref 98–111)
Creatinine, Ser: 0.92 mg/dL (ref 0.44–1.00)
GFR, Estimated: >60 mL/min (ref >60)
Glucose, Bld: 124 mg/dL — ABNORMAL HIGH (ref 70–99)
Potassium: 4.5 mmol/L (ref 3.5–5.1)
Sodium: 136 mmol/L (ref 135–145)

## 2021-05-07 LAB — CBC
HCT: 46.7 % — ABNORMAL HIGH (ref 36.0–46.0)
Hemoglobin: 15.5 g/dL — ABNORMAL HIGH (ref 12.0–15.0)
MCH: 30.8 pg (ref 26.0–34.0)
MCHC: 33.2 g/dL (ref 30.0–36.0)
MCV: 92.8 fL (ref 80.0–100.0)
Platelets: 228 10*3/uL (ref 150–400)
RBC: 5.03 MIL/uL (ref 3.87–5.11)
RDW: 12.6 % (ref 11.5–15.5)
WBC: 10 10*3/uL (ref 4.0–10.5)
nRBC: 0 % (ref 0.0–0.2)

## 2021-05-07 LAB — TROPONIN I (HIGH SENSITIVITY)
Troponin I (High Sensitivity): 3 ng/L (ref <18)
Troponin I (High Sensitivity): 3 ng/L (ref <18)

## 2021-05-07 MED ORDER — ONDANSETRON 4 MG PO TBDP
4.0000 mg | ORAL_TABLET | Freq: Once | ORAL | Status: AC
  Administered 2021-05-07: 4 mg via ORAL
  Filled 2021-05-07: qty 1

## 2021-05-07 NOTE — ED Notes (Signed)
Pt at [facility] Nurse Desk, states that she needs to pick her kids up. Encouraged pt to stay. Pt asked if she get her medications sent to the [facility]. Explained that we could not send her anything without her speaking with a doctor. Pt decided to leave anyway.  ?

## 2021-05-07 NOTE — ED Triage Notes (Addendum)
Pt comes pov with chest pain since this morning. PT drinks a fifth a day and stopped drinking 4 days ago. Pt also supposed to be taking thyroid meds but hasn't been on them in 5 years.  ?

## 2021-07-21 ENCOUNTER — Emergency Department
Admission: EM | Admit: 2021-07-21 | Discharge: 2021-07-21 | Discharge status: Against Medical Advice | Payer: Medicaid Other | Source: Home / Self Care

## 2021-08-26 ENCOUNTER — Encounter: Payer: Self-pay | Admitting: Emergency Medicine

## 2021-08-26 ENCOUNTER — Emergency Department
Admission: EM | Admit: 2021-08-26 | Discharge: 2021-08-26 | Disposition: A | Discharge status: Home / Self Care | Payer: Self-pay | Attending: Emergency Medicine | Admitting: Emergency Medicine

## 2021-08-26 ENCOUNTER — Emergency Department: Payer: Self-pay

## 2021-08-26 ENCOUNTER — Other Ambulatory Visit: Payer: Self-pay

## 2021-08-26 DIAGNOSIS — F12188 Cannabis abuse with other cannabis-induced disorder: Secondary | ICD-10-CM | POA: Insufficient documentation

## 2021-08-26 DIAGNOSIS — E860 Dehydration: Secondary | ICD-10-CM | POA: Insufficient documentation

## 2021-08-26 DIAGNOSIS — R10816 Epigastric abdominal tenderness: Secondary | ICD-10-CM | POA: Insufficient documentation

## 2021-08-26 DIAGNOSIS — F17200 Nicotine dependence, unspecified, uncomplicated: Secondary | ICD-10-CM | POA: Insufficient documentation

## 2021-08-26 DIAGNOSIS — R1110 Vomiting, unspecified: Secondary | ICD-10-CM | POA: Insufficient documentation

## 2021-08-26 LAB — CBC
HCT: 41.4 % (ref 36.0–46.0)
Hemoglobin: 13.7 g/dL (ref 12.0–15.0)
MCH: 31.4 pg (ref 26.0–34.0)
MCHC: 33.1 g/dL (ref 30.0–36.0)
MCV: 94.7 fL (ref 80.0–100.0)
Platelets: 215 10*3/uL (ref 150–400)
RBC: 4.37 MIL/uL (ref 3.87–5.11)
RDW: 12.2 % (ref 11.5–15.5)
WBC: 7.4 10*3/uL (ref 4.0–10.5)
nRBC: 0 % (ref 0.0–0.2)

## 2021-08-26 LAB — BASIC METABOLIC PANEL
Anion gap: 7 (ref 5–15)
BUN: 17 mg/dL (ref 6–20)
CO2: 26 mmol/L (ref 22–32)
Calcium: 9 mg/dL (ref 8.9–10.3)
Chloride: 105 mmol/L (ref 98–111)
Creatinine, Ser: 0.96 mg/dL (ref 0.44–1.00)
GFR, Estimated: >60 mL/min (ref >60)
Glucose, Bld: 129 mg/dL — ABNORMAL HIGH (ref 70–99)
Potassium: 4.1 mmol/L (ref 3.5–5.1)
Sodium: 138 mmol/L (ref 135–145)

## 2021-08-26 LAB — URINE DRUG SCREEN, QUALITATIVE (ARMC ONLY)
Amphetamines, Ur Screen: NOT DETECTED
Barbiturates, Ur Screen: NOT DETECTED
Benzodiazepine, Ur Scrn: NOT DETECTED
Cannabinoid 50 Ng, Ur ~~LOC~~: POSITIVE — AB
Cocaine Metabolite,Ur ~~LOC~~: NOT DETECTED
MDMA (Ecstasy)Ur Screen: NOT DETECTED
Methadone Scn, Ur: NOT DETECTED
Opiate, Ur Screen: NOT DETECTED
Phencyclidine (PCP) Ur S: NOT DETECTED
Tricyclic, Ur Screen: NOT DETECTED

## 2021-08-26 LAB — TROPONIN I (HIGH SENSITIVITY)
Troponin I (High Sensitivity): <2 ng/L (ref <18)
Troponin I (High Sensitivity): <2 ng/L (ref <18)

## 2021-08-26 LAB — URINALYSIS, ROUTINE W REFLEX MICROSCOPIC
Bilirubin Urine: NEGATIVE
Glucose, UA: NEGATIVE mg/dL
Ketones, ur: NEGATIVE mg/dL
Nitrite: NEGATIVE
Protein, ur: 30 mg/dL — AB
Specific Gravity, Urine: 1.031 — ABNORMAL HIGH (ref 1.005–1.030)
pH: 5 (ref 5.0–8.0)

## 2021-08-26 LAB — HEPATIC FUNCTION PANEL
ALT: 12 U/L (ref 0–44)
AST: 17 U/L (ref 15–41)
Albumin: 4.4 g/dL (ref 3.5–5.0)
Alkaline Phosphatase: 45 U/L (ref 38–126)
Bilirubin, Direct: <0.1 mg/dL (ref 0.0–0.2)
Total Bilirubin: 0.6 mg/dL (ref 0.3–1.2)
Total Protein: 6.9 g/dL (ref 6.5–8.1)

## 2021-08-26 LAB — POC URINE PREG, ED: Preg Test, Ur: NEGATIVE

## 2021-08-26 LAB — LIPASE, BLOOD: Lipase: 35 U/L (ref 11–51)

## 2021-08-26 MED ORDER — HALOPERIDOL LACTATE 5 MG/ML IJ SOLN
2.5000 mg | Freq: Once | INTRAMUSCULAR | Status: AC
  Administered 2021-08-26: 2.5 mg via INTRAVENOUS
  Filled 2021-08-26: qty 1

## 2021-08-26 MED ORDER — LACTATED RINGERS IV BOLUS
1000.0000 mL | Freq: Once | INTRAVENOUS | Status: AC
  Administered 2021-08-26: 1000 mL via INTRAVENOUS

## 2021-08-26 MED ORDER — PANTOPRAZOLE SODIUM 40 MG IV SOLR
40.0000 mg | Freq: Once | INTRAVENOUS | Status: AC
Start: 2021-08-26 — End: 2021-08-26
  Administered 2021-08-26: 40 mg via INTRAVENOUS
  Filled 2021-08-26: qty 10

## 2021-08-26 MED ORDER — ALUM & MAG HYDROXIDE-SIMETH 200-200-20 MG/5ML PO SUSP
30.0000 mL | Freq: Once | ORAL | Status: AC
  Administered 2021-08-26: 30 mL via ORAL
  Filled 2021-08-26: qty 30

## 2021-08-26 MED ORDER — OMEPRAZOLE MAGNESIUM 20 MG PO TBEC: 20.0000 mg | DELAYED_RELEASE_TABLET | Freq: Every day | ORAL | 0 refills | Status: AC

## 2021-08-26 MED ORDER — PROMETHAZINE HCL 25 MG PO TABS: 25.0000 mg | ORAL_TABLET | Freq: Four times a day (QID) | ORAL | 0 refills | Status: AC | PRN

## 2021-08-26 MED ORDER — ONDANSETRON HCL 4 MG/2ML IJ SOLN: 4.0000 mg | Freq: Once | INTRAMUSCULAR | Status: DC

## 2021-08-26 MED ORDER — DIPHENHYDRAMINE HCL 50 MG/ML IJ SOLN
25.0000 mg | Freq: Once | INTRAMUSCULAR | Status: AC
  Administered 2021-08-26: 25 mg via INTRAVENOUS
  Filled 2021-08-26: qty 1

## 2021-08-26 NOTE — ED Triage Notes (Signed)
Says not feeling well.  Cant keep food down, losing weight, chest pain for 4-5 months.

## 2021-08-26 NOTE — ED Triage Notes (Signed)
Pt here with cp that started a few months ago. Pt states pain is centered and does not radiate. Pt states pain is eased when she rakes a hot shower. Pt ambulatory to triage.

## 2021-08-26 NOTE — ED Notes (Signed)
Patient transported to CT 

## 2021-08-26 NOTE — ED Provider Notes (Signed)
[facility] Provider Note    Event Date/Time   First MD Initiated Contact with Patient 08/26/21 [phone]     (approximate)   History   Chest Pain   HPI  Stephanie Hartman is a 31 y.o. female here with nausea and vomiting with weight loss.  The patient states that for the last several months, she has had persistent, daily, nausea with vomiting.  She has had difficulty tolerating any p.o.  She has had weight loss.  She states that essentially with any kind of eating, she gets nausea, epigastric abdominal discomfort and vomits.  She has noticed that hot showers seem to help her symptoms.  Occasionally fatty foods worsen her symptoms.  She does admit to daily marijuana use.  She smokes tobacco as well.  Denies any alcohol use, and she did actually stop drinking alcohol several months ago after a fairly heavy history of this.  No other complaints.  No fevers or chills.  No recent travel.  No change in her bowel habits.  No melena.     Physical Exam   Triage Vital Signs: ED Triage Vitals [08/26/21 0745]  Enc Vitals Group     BP 110/73     Pulse Rate 77     Resp 16     Temp 98.7 F (37.1 C)     Temp Source Oral     SpO2 97 %     Weight 125 lb (56.7 kg)     Height 5\' 1"  (1.549 m)     Head Circumference      Peak Flow      Pain Score 5     Pain Loc      Pain Edu?      Excl. in [place]?     Most recent vital signs: Vitals:   08/26/21 0819 08/26/21 1153  BP: 105/68 110/78  Pulse: 70 74  Resp: 16 18  Temp:  98 F (36.7 C)  SpO2: 98% 98%     General: Awake, no distress.  CV:  Good peripheral perfusion.  Resp:  Normal effort.  Abd:  No distention.  Minimal epigastric tenderness. Other:  Mildly dry mucous membranes.   ED Results / Procedures / Treatments   Labs (all labs ordered are listed, but only abnormal results are displayed) Labs Reviewed  BASIC METABOLIC PANEL - Abnormal; Notable for the following components:      Result Value   Glucose, Bld 129  (*)    All other components within normal limits  URINALYSIS, ROUTINE W REFLEX MICROSCOPIC - Abnormal; Notable for the following components:   Color, Urine YELLOW (*)    APPearance CLOUDY (*)    Specific Gravity, Urine 1.031 (*)    Hgb urine dipstick MODERATE (*)    Protein, ur 30 (*)    Leukocytes,Ua LARGE (*)    Bacteria, UA RARE (*)    All other components within normal limits  URINE DRUG SCREEN, QUALITATIVE ([facility] ONLY) - Abnormal; Notable for the following components:   Cannabinoid 50 Ng, Ur [place] POSITIVE (*)    All other components within normal limits  CBC  HEPATIC FUNCTION PANEL  LIPASE, BLOOD  POC URINE PREG, ED  TROPONIN I (HIGH SENSITIVITY)  TROPONIN I (HIGH SENSITIVITY)     EKG Normal sinus rhythm, ventricular rate 77.  PR 150, QRS 66, QTc 4 2.  No acute ST elevations or depressions.   RADIOLOGY Chest x-ray: Mildly hyperinflated lungs without acute infiltrate   I  also independently reviewed and agree with radiologist interpretations.   PROCEDURES:  Critical Care performed: No   MEDICATIONS ORDERED IN ED: Medications  ondansetron (ZOFRAN) injection 4 mg (has no administration in time range)  lactated ringers bolus 1,000 mL (0 mLs Intravenous Stopped 08/26/21 1154)  haloperidol lactate (HALDOL) injection 2.5 mg (2.5 mg Intravenous Given 08/26/21 0941)  diphenhydrAMINE (BENADRYL) injection 25 mg (25 mg Intravenous Given 08/26/21 0941)  alum & mag hydroxide-simeth (MAALOX/MYLANTA) 200-200-20 MG/5ML suspension 30 mL (30 mLs Oral Given 08/26/21 0940)  pantoprazole (PROTONIX) injection 40 mg (40 mg Intravenous Given 08/26/21 0939)     IMPRESSION / MDM / ASSESSMENT AND PLAN / ED COURSE  I reviewed the triage vital signs and the nursing notes.                               The patient is on the cardiac monitor to evaluate for evidence of arrhythmia and/or significant heart rate changes.   Ddx:  Differential includes the following, with pertinent life- or  limb-threatening emergencies considered:  Cannabis hyperemesis, cyclical vomiting, gastritis, PUD, gastric obstruction, pancreatitis  Patient's presentation is most consistent with acute presentation with potential threat to life or bodily function.  MDM:  31 year old female with no significant past medical history here with nausea, vomiting, dehydration.  Clinically, primary suspicion is cannabis hyperemesis versus cyclical vomiting.  She does use marijuana daily.  Her vomiting is highly consistent with this and improves with showers.  She was given Haldol and fluids with significant symptomatic improvement.  She is tolerating p.o.  Her abdomen is soft and nontender.  She does have a history of alcohol use, as well as a family history of cancer, CT scan subsequently obtained and fortunately shows no evidence of cancer, pancreatitis, cyst, or other complications.  She has no other abnormalities noted on chest x-ray as well.  CBC and CMP unremarkable.  Renal function is normal.  LFTs within normal limits.  Lipase is 35 and normal.  UDS positive for THC.  Urinalysis shows likely contamination and dehydration, she denies any symptoms of UTI.  UPT negative.  Troponin negative.  Will treat symptomatically with Phenergan as an outpatient, started on an antacid and advised her to use topical capsaicin cream.  Encouraged her to decrease her THC use.   MEDICATIONS GIVEN IN ED: Medications  ondansetron (ZOFRAN) injection 4 mg (has no administration in time range)  lactated ringers bolus 1,000 mL (0 mLs Intravenous Stopped 08/26/21 1154)  haloperidol lactate (HALDOL) injection 2.5 mg (2.5 mg Intravenous Given 08/26/21 0941)  diphenhydrAMINE (BENADRYL) injection 25 mg (25 mg Intravenous Given 08/26/21 0941)  alum & mag hydroxide-simeth (MAALOX/MYLANTA) 200-200-20 MG/5ML suspension 30 mL (30 mLs Oral Given 08/26/21 0940)  pantoprazole (PROTONIX) injection 40 mg (40 mg Intravenous Given 08/26/21 0939)     Consults:   none   EMR reviewed       FINAL CLINICAL IMPRESSION(S) / ED DIAGNOSES   Final diagnoses:  Cannabis hyperemesis syndrome concurrent with and due to cannabis abuse (HCC)  Dehydration     Rx / DC Orders   ED Discharge Orders          Ordered    promethazine (PHENERGAN) 25 MG tablet  Every 6 hours PRN        08/26/21 1159    omeprazole (PRILOSEC OTC) 20 MG tablet  Daily        08/26/21 1159  Note:  This document was prepared using Dragon voice recognition software and may include unintentional dictation errors.   Shaune Pollack, MD 08/26/21 [phone]

## 2021-08-26 NOTE — Discharge Instructions (Signed)
Take the Phenergan for nausea. You can use this as a suppository as well if unable to take by mouth.  Start the antacid Omeprazole. This can also be purchased over-the-counter as generic Prilosec.  Try to minimize/reduce your marijuana use.  Avoid spicy foods or foods high in acid.  Consider topical capsaicin cream for nausea - apply this to your abdomen when nauseous to help with symptoms.

## 2021-09-04 DIAGNOSIS — G8929 Other chronic pain: Secondary | ICD-10-CM | POA: Diagnosis not present

## 2021-09-04 DIAGNOSIS — M2550 Pain in unspecified joint: Secondary | ICD-10-CM | POA: Diagnosis not present

## 2021-09-04 DIAGNOSIS — R079 Chest pain, unspecified: Secondary | ICD-10-CM | POA: Diagnosis not present

## 2021-10-27 ENCOUNTER — Emergency Department: Payer: Medicaid Other

## 2021-10-27 ENCOUNTER — Other Ambulatory Visit: Payer: Self-pay

## 2021-10-27 ENCOUNTER — Emergency Department
Admission: EM | Admit: 2021-10-27 | Discharge: 2021-10-27 | Disposition: A | Discharge status: Home / Self Care | Payer: Medicaid Other | Attending: Emergency Medicine | Admitting: Emergency Medicine

## 2021-10-27 DIAGNOSIS — Z20822 Contact with and (suspected) exposure to covid-19: Secondary | ICD-10-CM | POA: Insufficient documentation

## 2021-10-27 DIAGNOSIS — R0789 Other chest pain: Secondary | ICD-10-CM | POA: Insufficient documentation

## 2021-10-27 DIAGNOSIS — F12188 Cannabis abuse with other cannabis-induced disorder: Secondary | ICD-10-CM | POA: Insufficient documentation

## 2021-10-27 DIAGNOSIS — R110 Nausea: Secondary | ICD-10-CM | POA: Insufficient documentation

## 2021-10-27 DIAGNOSIS — R630 Anorexia: Secondary | ICD-10-CM | POA: Insufficient documentation

## 2021-10-27 LAB — TROPONIN I (HIGH SENSITIVITY): Troponin I (High Sensitivity): 3 ng/L (ref <18)

## 2021-10-27 LAB — CBC
HCT: 41.7 % (ref 36.0–46.0)
Hemoglobin: 13.9 g/dL (ref 12.0–15.0)
MCH: 30.2 pg (ref 26.0–34.0)
MCHC: 33.3 g/dL (ref 30.0–36.0)
MCV: 90.5 fL (ref 80.0–100.0)
Platelets: 199 10*3/uL (ref 150–400)
RBC: 4.61 MIL/uL (ref 3.87–5.11)
RDW: 11.9 % (ref 11.5–15.5)
WBC: 7.5 10*3/uL (ref 4.0–10.5)
nRBC: 0 % (ref 0.0–0.2)

## 2021-10-27 LAB — BASIC METABOLIC PANEL
Anion gap: 6 (ref 5–15)
BUN: 9 mg/dL (ref 6–20)
CO2: 25 mmol/L (ref 22–32)
Calcium: 9 mg/dL (ref 8.9–10.3)
Chloride: 105 mmol/L (ref 98–111)
Creatinine, Ser: 0.84 mg/dL (ref 0.44–1.00)
GFR, Estimated: >60 mL/min (ref >60)
Glucose, Bld: 119 mg/dL — ABNORMAL HIGH (ref 70–99)
Potassium: 3.6 mmol/L (ref 3.5–5.1)
Sodium: 136 mmol/L (ref 135–145)

## 2021-10-27 LAB — RESP PANEL BY RT-PCR (FLU A&B, COVID) ARPGX2
Influenza A by PCR: NEGATIVE
Influenza B by PCR: NEGATIVE
SARS Coronavirus 2 by RT PCR: NEGATIVE

## 2021-10-27 MED ORDER — ALBUTEROL SULFATE HFA 108 (90 BASE) MCG/ACT IN AERS: 2.0000 | INHALATION_SPRAY | Freq: Four times a day (QID) | RESPIRATORY_TRACT | 2 refills | Status: AC | PRN

## 2021-10-27 MED ORDER — PREDNISONE 50 MG PO TABS: 50.0000 mg | ORAL_TABLET | Freq: Every day | ORAL | 0 refills | Status: AC

## 2021-10-27 NOTE — ED Triage Notes (Signed)
Patient reports intermittent chest pain, frequent night sweats and 15 pound unintentional weight loss in the past 5 months. Denies shortness of breath, denies cough. AOX4. Resp even, unlabored on RA. Ambulatory with steady gait.

## 2021-10-27 NOTE — ED Provider Notes (Signed)
[facility] Provider Note    Event Date/Time   First MD Initiated Contact with Patient 10/27/21 [phone]     (approximate)   History   Chest Pain   HPI  Stephanie Hartman is a 31 y.o. female who presents with intermittent chest pain over the last 3 to 4 months.  She denies shortness of breath.  No cough.  No pleurisy.  Some nausea with significantly decreased appetite.  Only thing that helps is a hot shower.  Was treated with prednisone in the past which seemed to help for about a month.  She does smoke marijuana and drink alcohol.  No fevers or chills.  No calf pain or swelling.  She is on antacids     Physical Exam   Triage Vital Signs: ED Triage Vitals  Enc Vitals Group     BP 10/27/21 0627 116/73     Pulse Rate 10/27/21 0627 61     Resp 10/27/21 0627 18     Temp 10/27/21 0627 98 F (36.7 C)     Temp Source 10/27/21 0627 Oral     SpO2 10/27/21 0627 95 %     Weight 10/27/21 0629 54 kg (119 lb)     Height 10/27/21 0629 1.549 m (5\' 1" )     Head Circumference --      Peak Flow --      Pain Score 10/27/21 0628 6     Pain Loc --      Pain Edu? --      Excl. in [place]? --     Most recent vital signs: Vitals:   10/27/21 0627 10/27/21 0747  BP: 116/73   Pulse: 61   Resp: 18 18  Temp: 98 F (36.7 C)   SpO2: 95% 96%     General: Awake, no distress.  CV:  Good peripheral perfusion.  Resp:  Normal effort.  Clear to auscultation bilaterally Abd:  No distention.  Other:  No calf swelling   ED Results / Procedures / Treatments   Labs (all labs ordered are listed, but only abnormal results are displayed) Labs Reviewed  BASIC METABOLIC PANEL - Abnormal; Notable for the following components:      Result Value   Glucose, Bld 119 (*)    All other components within normal limits  RESP PANEL BY RT-PCR (FLU A&B, COVID) ARPGX2  CBC  POC URINE PREG, ED  TROPONIN I (HIGH SENSITIVITY)  TROPONIN I (HIGH SENSITIVITY)     EKG  ED ECG REPORT I, Lavonia Drafts, the attending physician, personally viewed and interpreted this ECG.  Date: 10/27/2021  Rhythm: normal sinus rhythm QRS Axis: normal Intervals: normal ST/T Wave abnormalities: normal Narrative Interpretation: Question old anterior infarct    RADIOLOGY Chest x-ray viewed interpreted by me, no acute abnormality    PROCEDURES:  Critical Care performed:   Procedures   MEDICATIONS ORDERED IN ED: Medications - No data to display   IMPRESSION / MDM / [place] / ED COURSE  I reviewed the triage vital signs and the nursing notes. Patient's presentation is most consistent with acute presentation with potential threat to life or bodily function.  Patient presents with chest pain over the last 3 months as detailed above.  She reports that she is asymptomatic at this time.  She reports interferes with her sleep.  She reports hot showers seem to improve this.  Differential includes GERD, cannabis hyperemesis, nonspecific chest pain  Work-up is reassuring, normal high  sensitive troponin, chest x-ray unremarkable.  Question old infarct on EKG  Strongly encouraged marijuana cessation.  Will trial burst prednisone given that gave her relief before  She requests albuterol inhaler refill which I will provide  Cardiology outpatient follow-up for further evaluation        FINAL CLINICAL IMPRESSION(S) / ED DIAGNOSES   Final diagnoses:  Atypical chest pain  Cannabis hyperemesis syndrome concurrent with and due to cannabis abuse ([place])     Rx / DC Orders   ED Discharge Orders          Ordered    Ambulatory referral to Cardiology       Comments: If you have not heard from the Cardiology office within the next 72 hours please call [phone].   10/27/21 0738    predniSONE (DELTASONE) 50 MG tablet  Daily with breakfast        10/27/21 0738    albuterol (VENTOLIN HFA) 108 (90 Base) MCG/ACT inhaler  Every 6 hours PRN        10/27/21 0738              Note:  This document was prepared using Dragon voice recognition software and may include unintentional dictation errors.   Lavonia Drafts, MD 10/27/21 [phone]

## 2021-12-10 ENCOUNTER — Telehealth (HOSPITAL_COMMUNITY): Payer: Self-pay

## 2021-12-10 NOTE — Telephone Encounter (Signed)
Pt called with complaints of L sided weakness and a warm tenderness on the left side of her head w/fever. I sent a message to our PA to discuss with Dr. Corliss Skains. Bc of her symptoms, the pt needs to go to the ED for evaluation. I have relayed this message to the patient and she agreed with this plan. AW

## 2021-12-29 ENCOUNTER — Ambulatory Visit: Payer: Medicaid Other | Attending: Cardiology | Admitting: Cardiology

## 2021-12-29 ENCOUNTER — Encounter: Payer: Self-pay | Admitting: Cardiology

## 2021-12-29 VITALS — BP 122/80 | HR 74 | Ht 61.0 in | Wt 124.0 lb

## 2021-12-29 DIAGNOSIS — R079 Chest pain, unspecified: Secondary | ICD-10-CM | POA: Diagnosis not present

## 2021-12-29 DIAGNOSIS — Z7289 Other problems related to lifestyle: Secondary | ICD-10-CM

## 2021-12-29 NOTE — Patient Instructions (Signed)
Medication Instructions:   Your physician recommends that you continue on your current medications as directed. Please refer to the Current Medication list given to you today.  *If you need a refill on your cardiac medications before your next appointment, please call your pharmacy*   Lab Work:  None Ordered  If you have labs (blood work) drawn today and your tests are completely normal, you will receive your results only by: MyChart Message (if you have MyChart) OR A paper copy in the mail If you have any lab test that is abnormal or we need to change your treatment, we will call you to review the results.   Testing/Procedures:  None Ordered   Follow-Up: At [facility], you and your health needs are our priority.  As part of our continuing mission to provide you with exceptional heart care, we have created designated Provider Care Teams.  These Care Teams include your primary Cardiologist (physician) and Advanced Practice Providers (APPs -  Physician Assistants and Nurse Practitioners) who all work together to provide you with the care you need, when you need it.  We recommend signing up for the patient portal called "MyChart".  Sign up information is provided on this After Visit Summary.  MyChart is used to connect with patients for Virtual Visits (Telemedicine).  Patients are able to view lab/test results, encounter notes, upcoming appointments, etc.  Non-urgent messages can be sent to your provider as well.   To learn more about what you can do with MyChart, go to [email or web address].    Your next appointment:  PRN    

## 2021-12-29 NOTE — Progress Notes (Signed)
Cardiology Office Note:    Date:  12/29/2021   ID:  Stephanie Hartman, DOB Oct 20, 1990, MRN [id number]  PCP:  Patient, No Pcp Per   [facility] Providers Cardiologist:  None     Referring MD: Jene Every, MD   Chief Complaint  Patient presents with   New Patient (Initial Visit)    Chest pain, SOB (copd), Family Hx    History of Present Illness:    Stephanie Hartman is a 31 y.o. female, currently engages in vaping, presenting with chest pain.  She states her pain symptoms of chest pain on and off for about 2 to 3 months.  Has not had any symptoms over the past 3 weeks.  Broke up with girlfriend 3 weeks ago, since then, symptoms seem to have improved.  Chest pains were not associated with exertion.  Currently vapes, also smokes marijuana.  Previously used street drugs, currently sober.  Doing okay overall.  Past Medical History:  Diagnosis Date   Thyroid disease     Past Surgical History:  Procedure Laterality Date   FRACTURE SURGERY Right 1997   "knee surgery where they took a foreign body out, but I can't completely remember what it was"   IR ANGIO INTRA EXTRACRAN SEL COM CAROTID INNOMINATE BILAT MOD SED  05/30/2020   IR ANGIO INTRA EXTRACRAN SEL COM CAROTID INNOMINATE BILAT MOD SED  07/17/2020   IR ANGIO VERTEBRAL SEL SUBCLAVIAN INNOMINATE BILAT MOD SED  05/30/2020   IR RADIOLOGIST EVAL & MGMT  05/23/2020   IR RADIOLOGIST EVAL & MGMT  08/05/2020   IR TRANSCATH/EMBOLIZ  07/17/2020   RADIOLOGY WITH ANESTHESIA N/A 07/17/2020   Procedure: IR WITH ANESTHESIA EMBOLIZATION;  Surgeon: Julieanne Cotton, MD;  Location: [facility] OR;  Service: Radiology;  Laterality: N/A;   TUBAL LIGATION      Current Medications: Current Meds  Medication Sig   acetaminophen (TYLENOL) 500 MG tablet Take 2,000 mg by mouth daily as needed for moderate pain.   albuterol (VENTOLIN HFA) 108 (90 Base) MCG/ACT inhaler Inhale 2 puffs into the lungs every 6 (six) hours as needed for wheezing or shortness of  breath.   aspirin-acetaminophen-caffeine (EXCEDRIN MIGRAINE) 250-250-65 MG tablet Take 4 tablets by mouth 3 (three) times daily as needed for headache.   naphazoline-pheniramine (VISINE) 0.025-0.3 % ophthalmic solution Place 1 drop into both eyes daily as needed for eye irritation.   omeprazole (PRILOSEC OTC) 20 MG tablet Take 1 tablet (20 mg total) by mouth daily for 14 days.   Current Facility-Administered Medications for the 12/29/21 encounter (Office Visit) with Debbe Odea, MD  Medication   diphenhydrAMINE (BENADRYL) capsule 50 mg     Allergies:   Contrast media [iodinated contrast media]   Social History   Socioeconomic History   Marital status: Single    Spouse name: Not on file   Number of children: Not on file   Years of education: Not on file   Highest education level: Not on file  Occupational History   Not on file  Tobacco Use   Smoking status: Every Day    Packs/day: 0.50    Types: Cigarettes, E-cigarettes   Smokeless tobacco: Never  Vaping Use   Vaping Use: Every day  Substance and Sexual Activity   Alcohol use: Yes    Comment: occasional   Drug use: Yes    Types: Marijuana    Comment: "2-3x/day" - 07/15/20   Sexual activity: Not on file  Other Topics Concern   Not on  file  Social History Narrative   Not on file   Social Determinants of Health   Financial Resource Strain: Not on file  Food Insecurity: Not on file  Transportation Needs: Not on file  Physical Activity: Not on file  Stress: Not on file  Social Connections: Not on file     Family History: The patient's family history includes Heart Problems in her maternal uncle; Heart attack in her maternal grandfather.  ROS:   Please see the history of present illness.     All other systems reviewed and are negative.  EKGs/Labs/Other Studies Reviewed:    The following studies were reviewed today:   EKG:  EKG is  ordered today.  The ekg ordered today demonstrates normal sinus rhythm,  normal ECG.  Recent Labs: 08/26/2021: ALT 12 10/27/2021: BUN 9; Creatinine, Ser 0.84; Hemoglobin 13.9; Platelets 199; Potassium 3.6; Sodium 136  Recent Lipid Panel No results found for: "CHOL", "TRIG", "HDL", "CHOLHDL", "VLDL", "LDLCALC", "LDLDIRECT"   Risk Assessment/Calculations:             Physical Exam:    VS:  BP 122/80 (BP Location: Right Arm)   Pulse 74   Ht 5\' 1"  (1.549 m)   Wt 124 lb (56.2 kg)   SpO2 96%   BMI 23.43 kg/m     Wt Readings from Last 3 Encounters:  12/29/21 124 lb (56.2 kg)  10/27/21 119 lb (54 kg)  08/26/21 125 lb (56.7 kg)     GEN:  Well nourished, well developed in no acute distress HEENT: Normal NECK: No JVD; No carotid bruits CARDIAC: RRR, no murmurs, rubs, gallops RESPIRATORY:  Clear to auscultation without rales, wheezing or rhonchi  ABDOMEN: Soft, non-tender, non-distended MUSCULOSKELETAL:  No edema; No deformity  SKIN: Warm and dry NEUROLOGIC:  Alert and oriented x 3 PSYCHIATRIC:  Normal affect   ASSESSMENT:    1. Chest pain of uncertain etiology   2. Engages in vaping    PLAN:    In order of problems listed above:  Chest pain, appears very atypical, not consistent with cardiac etiology.  Has not had any symptoms over the past 3 weeks, raising possibility of stress as likely cause.  Educated on cardiac causes of chest pain, will consider cardiac testing if pain returns or becomes persistent. Engages in vaping, cessation advised.  Follow-up as needed      Medication Adjustments/Labs and Tests Ordered: Current medicines are reviewed at length with the patient today.  Concerns regarding medicines are outlined above.  Orders Placed This Encounter  Procedures   EKG 12-Lead   No orders of the defined types were placed in this encounter.   Patient Instructions  Medication Instructions:   Your physician recommends that you continue on your current medications as directed. Please refer to the Current Medication list given to you  today.  *If you need a refill on your cardiac medications before your next appointment, please call your pharmacy*   Lab Work:  None Ordered  If you have labs (blood work) drawn today and your tests are completely normal, you will receive your results only by: MyChart Message (if you have MyChart) OR A paper copy in the mail If you have any lab test that is abnormal or we need to change your treatment, we will call you to review the results.   Testing/Procedures:  None Ordered   Follow-Up: At [facility], you and your health needs are our priority.  As part of our continuing mission to  provide you with exceptional heart care, we have created designated Provider Care Teams.  These Care Teams include your primary Cardiologist (physician) and Advanced Practice Providers (APPs -  Physician Assistants and Nurse Practitioners) who all work together to provide you with the care you need, when you need it.  We recommend signing up for the patient portal called "MyChart".  Sign up information is provided on this After Visit Summary.  MyChart is used to connect with patients for Virtual Visits (Telemedicine).  Patients are able to view lab/test results, encounter notes, upcoming appointments, etc.  Non-urgent messages can be sent to your provider as well.   To learn more about what you can do with MyChart, go to [email or web address].    Your next appointment:  PRN        Signed, Debbe Odea, MD  12/29/2021 10:38 AM    [facility]

## 2022-02-03 DIAGNOSIS — E039 Hypothyroidism, unspecified: Secondary | ICD-10-CM | POA: Diagnosis not present

## 2022-02-03 DIAGNOSIS — M2550 Pain in unspecified joint: Secondary | ICD-10-CM | POA: Diagnosis not present

## 2022-03-12 DIAGNOSIS — M546 Pain in thoracic spine: Secondary | ICD-10-CM | POA: Diagnosis not present

## 2022-03-12 DIAGNOSIS — M5136 Other intervertebral disc degeneration, lumbar region: Secondary | ICD-10-CM | POA: Diagnosis not present

## 2022-03-12 DIAGNOSIS — M791 Myalgia, unspecified site: Secondary | ICD-10-CM | POA: Diagnosis not present

## 2022-03-12 DIAGNOSIS — Z79891 Long term (current) use of opiate analgesic: Secondary | ICD-10-CM | POA: Diagnosis not present

## 2022-03-19 DIAGNOSIS — G5603 Carpal tunnel syndrome, bilateral upper limbs: Secondary | ICD-10-CM | POA: Diagnosis not present

## 2022-04-23 DIAGNOSIS — R87610 Atypical squamous cells of undetermined significance on cytologic smear of cervix (ASC-US): Secondary | ICD-10-CM | POA: Diagnosis not present

## 2022-04-23 DIAGNOSIS — Z124 Encounter for screening for malignant neoplasm of cervix: Secondary | ICD-10-CM | POA: Diagnosis not present

## 2022-04-23 DIAGNOSIS — Z7251 High risk heterosexual behavior: Secondary | ICD-10-CM | POA: Diagnosis not present

## 2022-04-23 DIAGNOSIS — E039 Hypothyroidism, unspecified: Secondary | ICD-10-CM | POA: Diagnosis not present

## 2022-04-23 DIAGNOSIS — M546 Pain in thoracic spine: Secondary | ICD-10-CM | POA: Diagnosis not present

## 2022-05-12 DIAGNOSIS — R300 Dysuria: Secondary | ICD-10-CM | POA: Diagnosis not present

## 2022-05-12 DIAGNOSIS — N898 Other specified noninflammatory disorders of vagina: Secondary | ICD-10-CM | POA: Diagnosis not present

## 2022-05-15 DIAGNOSIS — Z91041 Radiographic dye allergy status: Secondary | ICD-10-CM | POA: Diagnosis not present

## 2022-05-15 DIAGNOSIS — R079 Chest pain, unspecified: Secondary | ICD-10-CM | POA: Diagnosis not present

## 2022-05-15 DIAGNOSIS — F17200 Nicotine dependence, unspecified, uncomplicated: Secondary | ICD-10-CM | POA: Diagnosis not present

## 2022-05-15 DIAGNOSIS — R0789 Other chest pain: Secondary | ICD-10-CM | POA: Diagnosis not present

## 2022-05-18 DIAGNOSIS — M791 Myalgia, unspecified site: Secondary | ICD-10-CM | POA: Diagnosis not present

## 2022-06-02 DIAGNOSIS — M791 Myalgia, unspecified site: Secondary | ICD-10-CM | POA: Diagnosis not present

## 2022-06-02 DIAGNOSIS — M545 Low back pain, unspecified: Secondary | ICD-10-CM | POA: Diagnosis not present

## 2022-06-02 DIAGNOSIS — M546 Pain in thoracic spine: Secondary | ICD-10-CM | POA: Diagnosis not present

## 2022-06-02 DIAGNOSIS — M62830 Muscle spasm of back: Secondary | ICD-10-CM | POA: Diagnosis not present

## 2022-06-17 DIAGNOSIS — S61431A Puncture wound without foreign body of right hand, initial encounter: Secondary | ICD-10-CM | POA: Diagnosis not present

## 2022-06-17 DIAGNOSIS — W5501XA Bitten by cat, initial encounter: Secondary | ICD-10-CM | POA: Diagnosis not present

## 2022-06-17 DIAGNOSIS — Z91041 Radiographic dye allergy status: Secondary | ICD-10-CM | POA: Diagnosis not present

## 2022-06-17 DIAGNOSIS — F17200 Nicotine dependence, unspecified, uncomplicated: Secondary | ICD-10-CM | POA: Diagnosis not present

## 2022-06-17 DIAGNOSIS — S61531A Puncture wound without foreign body of right wrist, initial encounter: Secondary | ICD-10-CM | POA: Diagnosis not present

## 2022-06-19 DIAGNOSIS — S61451A Open bite of right hand, initial encounter: Secondary | ICD-10-CM | POA: Diagnosis not present

## 2022-06-19 DIAGNOSIS — W5501XA Bitten by cat, initial encounter: Secondary | ICD-10-CM | POA: Diagnosis not present

## 2022-06-24 DIAGNOSIS — M545 Low back pain, unspecified: Secondary | ICD-10-CM | POA: Diagnosis not present

## 2022-07-01 DIAGNOSIS — M545 Low back pain, unspecified: Secondary | ICD-10-CM | POA: Diagnosis not present

## 2022-07-07 DIAGNOSIS — M545 Low back pain, unspecified: Secondary | ICD-10-CM | POA: Diagnosis not present

## 2022-07-08 DIAGNOSIS — M8589 Other specified disorders of bone density and structure, multiple sites: Secondary | ICD-10-CM | POA: Diagnosis not present

## 2022-07-21 DIAGNOSIS — M545 Low back pain, unspecified: Secondary | ICD-10-CM | POA: Diagnosis not present

## 2023-06-15 ENCOUNTER — Other Ambulatory Visit (HOSPITAL_COMMUNITY): Payer: Self-pay | Admitting: Interventional Radiology

## 2023-06-15 DIAGNOSIS — I671 Cerebral aneurysm, nonruptured: Secondary | ICD-10-CM

## 2023-06-28 ENCOUNTER — Telehealth (HOSPITAL_COMMUNITY): Payer: Self-pay

## 2023-06-28 ENCOUNTER — Ambulatory Visit (HOSPITAL_COMMUNITY)

## 2023-06-28 NOTE — Telephone Encounter (Signed)
 Pt called to cancel consult. She lives in [place] and it is hard for her to travel back and forth. She will find a physician closer her way. AB

## 2023-07-23 ENCOUNTER — Encounter (HOSPITAL_COMMUNITY): Payer: Self-pay | Admitting: Interventional Radiology
